# Patient Record
Sex: Female | Born: 1965 | Race: White | Hispanic: No | Marital: Single | Smoking: Current every day smoker
Health system: Southern US, Community
[De-identification: ages and names within clinical notes are randomized; demographics above are authoritative.]

## PROBLEM LIST (undated history)

## (undated) DIAGNOSIS — K9189 Other postprocedural complications and disorders of digestive system: Secondary | ICD-10-CM

## (undated) DIAGNOSIS — K567 Ileus, unspecified: Secondary | ICD-10-CM

## (undated) DIAGNOSIS — Z6831 Body mass index (BMI) 31.0-31.9, adult: Secondary | ICD-10-CM

## (undated) DIAGNOSIS — N289 Disorder of kidney and ureter, unspecified: Secondary | ICD-10-CM

## (undated) DIAGNOSIS — Z720 Tobacco use: Secondary | ICD-10-CM

## (undated) DIAGNOSIS — K5720 Diverticulitis of large intestine with perforation and abscess without bleeding: Secondary | ICD-10-CM

## (undated) HISTORY — PX: TUBAL LIGATION: SHX77

## (undated) HISTORY — DX: Disorder of kidney and ureter, unspecified: N28.9

---

## 2012-12-17 ENCOUNTER — Encounter (HOSPITAL_COMMUNITY): Payer: Self-pay | Admitting: *Deleted

## 2012-12-17 ENCOUNTER — Emergency Department (HOSPITAL_COMMUNITY): Payer: Self-pay

## 2012-12-17 ENCOUNTER — Inpatient Hospital Stay (HOSPITAL_COMMUNITY)
Admission: EM | Admit: 2012-12-17 | Discharge: 2012-12-29 | DRG: 330 | Disposition: A | Discharge status: Home / Self Care | Payer: MEDICAID | Attending: General Surgery | Admitting: General Surgery

## 2012-12-17 DIAGNOSIS — Z6831 Body mass index (BMI) 31.0-31.9, adult: Secondary | ICD-10-CM

## 2012-12-17 DIAGNOSIS — K5732 Diverticulitis of large intestine without perforation or abscess without bleeding: Principal | ICD-10-CM | POA: Diagnosis present

## 2012-12-17 DIAGNOSIS — K567 Ileus, unspecified: Secondary | ICD-10-CM | POA: Diagnosis not present

## 2012-12-17 DIAGNOSIS — Z720 Tobacco use: Secondary | ICD-10-CM | POA: Diagnosis present

## 2012-12-17 DIAGNOSIS — K630 Abscess of intestine: Secondary | ICD-10-CM | POA: Diagnosis present

## 2012-12-17 DIAGNOSIS — K5720 Diverticulitis of large intestine with perforation and abscess without bleeding: Secondary | ICD-10-CM | POA: Diagnosis present

## 2012-12-17 DIAGNOSIS — K5792 Diverticulitis of intestine, part unspecified, without perforation or abscess without bleeding: Secondary | ICD-10-CM

## 2012-12-17 DIAGNOSIS — Z23 Encounter for immunization: Secondary | ICD-10-CM

## 2012-12-17 DIAGNOSIS — Y838 Other surgical procedures as the cause of abnormal reaction of the patient, or of later complication, without mention of misadventure at the time of the procedure: Secondary | ICD-10-CM | POA: Diagnosis not present

## 2012-12-17 DIAGNOSIS — D62 Acute posthemorrhagic anemia: Secondary | ICD-10-CM | POA: Diagnosis present

## 2012-12-17 DIAGNOSIS — F17200 Nicotine dependence, unspecified, uncomplicated: Secondary | ICD-10-CM | POA: Diagnosis present

## 2012-12-17 DIAGNOSIS — K631 Perforation of intestine (nontraumatic): Secondary | ICD-10-CM

## 2012-12-17 DIAGNOSIS — K9189 Other postprocedural complications and disorders of digestive system: Secondary | ICD-10-CM | POA: Diagnosis not present

## 2012-12-17 DIAGNOSIS — K929 Disease of digestive system, unspecified: Secondary | ICD-10-CM | POA: Diagnosis not present

## 2012-12-17 DIAGNOSIS — K560 Paralytic ileus: Secondary | ICD-10-CM | POA: Diagnosis not present

## 2012-12-17 HISTORY — DX: Ileus, unspecified: K56.7

## 2012-12-17 HISTORY — DX: Body mass index (bmi) 31.0-31.9, adult: Z68.31

## 2012-12-17 HISTORY — DX: Other postprocedural complications and disorders of digestive system: K91.89

## 2012-12-17 HISTORY — DX: Diverticulitis of large intestine with perforation and abscess without bleeding: K57.20

## 2012-12-17 HISTORY — DX: Tobacco use: Z72.0

## 2012-12-17 LAB — COMPREHENSIVE METABOLIC PANEL
ALT: 7 U/L (ref 0–35)
AST: 10 U/L (ref 0–37)
Albumin: 3.3 g/dL — ABNORMAL LOW (ref 3.5–5.2)
Alkaline Phosphatase: 62 U/L (ref 39–117)
CO2: 22 mEq/L (ref 19–32)
Chloride: 102 mEq/L (ref 96–112)
GFR calc non Af Amer: >90 mL/min (ref >90)
Potassium: 3.8 mEq/L (ref 3.5–5.1)
Sodium: 138 mEq/L (ref 135–145)
Total Bilirubin: 0.2 mg/dL — ABNORMAL LOW (ref 0.3–1.2)

## 2012-12-17 LAB — WET PREP, GENITAL
Clue Cells Wet Prep HPF POC: NONE SEEN
Trich, Wet Prep: NONE SEEN

## 2012-12-17 LAB — URINALYSIS, ROUTINE W REFLEX MICROSCOPIC
Glucose, UA: NEGATIVE mg/dL
Leukocytes, UA: NEGATIVE
Nitrite: NEGATIVE
Protein, ur: NEGATIVE mg/dL
pH: 5 (ref 5.0–8.0)

## 2012-12-17 LAB — CBC WITH DIFFERENTIAL/PLATELET
Basophils Absolute: 0 10*3/uL (ref 0.0–0.1)
Basophils Relative: 0 % (ref 0–1)
HCT: 37.7 % (ref 36.0–46.0)
Lymphocytes Relative: 8 % — ABNORMAL LOW (ref 12–46)
MCHC: 32.4 g/dL (ref 30.0–36.0)
Monocytes Absolute: 0.2 10*3/uL (ref 0.1–1.0)
Neutro Abs: 5.2 10*3/uL (ref 1.7–7.7)
Neutrophils Relative %: 88 % — ABNORMAL HIGH (ref 43–77)
Platelets: 225 10*3/uL (ref 150–400)
RDW: 15.3 % (ref 11.5–15.5)
WBC: 5.9 10*3/uL (ref 4.0–10.5)

## 2012-12-17 LAB — POCT PREGNANCY, URINE: Preg Test, Ur: NEGATIVE

## 2012-12-17 MED ORDER — IOHEXOL 300 MG/ML  SOLN
50.0000 mL | Freq: Once | INTRAMUSCULAR | Status: AC | PRN
  Administered 2012-12-17: 50 mL via ORAL

## 2012-12-17 MED ORDER — IOHEXOL 300 MG/ML  SOLN
100.0000 mL | Freq: Once | INTRAMUSCULAR | Status: AC | PRN
  Administered 2012-12-17: 100 mL via INTRAVENOUS

## 2012-12-17 MED ORDER — LIDOCAINE HCL (PF) 1 % IJ SOLN
INTRAMUSCULAR | Status: AC
  Administered 2012-12-17: 5 mL
  Filled 2012-12-17: qty 5

## 2012-12-17 MED ORDER — HYDROMORPHONE HCL PF 1 MG/ML IJ SOLN
1.0000 mg | Freq: Once | INTRAMUSCULAR | Status: AC
  Administered 2012-12-17: 1 mg via INTRAVENOUS
  Filled 2012-12-17: qty 1

## 2012-12-17 MED ORDER — HYDROMORPHONE HCL PF 1 MG/ML IJ SOLN
1.0000 mg | Freq: Once | INTRAMUSCULAR | Status: AC
  Administered 2012-12-18: 1 mg via INTRAVENOUS
  Filled 2012-12-17: qty 1

## 2012-12-17 MED ORDER — CEFTRIAXONE SODIUM 250 MG IJ SOLR
250.0000 mg | Freq: Once | INTRAMUSCULAR | Status: AC
  Administered 2012-12-17: 250 mg via INTRAMUSCULAR
  Filled 2012-12-17: qty 250

## 2012-12-17 MED ORDER — CIPROFLOXACIN IN D5W 400 MG/200ML IV SOLN
400.0000 mg | Freq: Once | INTRAVENOUS | Status: AC
  Administered 2012-12-17: 400 mg via INTRAVENOUS
  Filled 2012-12-17: qty 200

## 2012-12-17 MED ORDER — ACETAMINOPHEN 325 MG PO TABS
975.0000 mg | ORAL_TABLET | Freq: Once | ORAL | Status: AC
  Administered 2012-12-17: 975 mg via ORAL
  Filled 2012-12-17: qty 3

## 2012-12-17 MED ORDER — SODIUM CHLORIDE 0.9 % IV BOLUS (SEPSIS)
1000.0000 mL | Freq: Once | INTRAVENOUS | Status: AC
  Administered 2012-12-17: 1000 mL via INTRAVENOUS

## 2012-12-17 MED ORDER — METRONIDAZOLE IN NACL 5-0.79 MG/ML-% IV SOLN
500.0000 mg | Freq: Once | INTRAVENOUS | Status: AC
  Administered 2012-12-17: 500 mg via INTRAVENOUS
  Filled 2012-12-17: qty 100

## 2012-12-17 MED ORDER — AZITHROMYCIN 250 MG PO TABS
1000.0000 mg | ORAL_TABLET | Freq: Once | ORAL | Status: AC
  Administered 2012-12-17: 1000 mg via ORAL
  Filled 2012-12-17: qty 4

## 2012-12-17 NOTE — ED Provider Notes (Signed)
History     CSN: [id number]  Arrival date & time 12/17/12  1622   First MD Initiated Contact with Patient 12/17/12 1659      Chief Complaint  Patient presents with  . Abdominal Pain  . Vaginal Bleeding    (Consider location/radiation/quality/duration/timing/severity/associated sxs/prior treatment) The history is provided by the patient and medical records.    Joanne Mora is a 47 y.o. female  with a hx of tubal ligation presents to the Emergency Department complaining of gradual, persistent, progressively worsening pelvic and abdominal pain onset this AM.  Pt noted spotty vaginal bleeding and discharge 2 days ago and pelvic pressure which have resolved  Pt thought it was a UTI.  LMP was 2 weeks ago.   Then this AM pt awoke with RQU pain that radiates to the lower abd.  Pt is sexually active with the same partner for the last 2 years and does not use protection.  Pt describes that pain as sharp, stabbing that radiates throughout the R side of the and, rated at a 10/10.  Associated symptoms include nausea.  Nothing makes it better and nothing makes it worse.  Pt denies fever, chills, headache, chest pain, shortness of breath, nausea, vomiting, diarrhea, weakness, dizziness, syncope.    Pt states weight gain in the last few months along with swelling of her hands and feet.    History reviewed. No pertinent past medical history.  History reviewed. No pertinent past surgical history.  No family history on file.  History  Substance Use Topics  . Smoking status: Current Every Day Smoker -- 1.0 packs/day    Types: Cigarettes  . Smokeless tobacco: Not on file  . Alcohol Use: Yes     Comment: socially    OB History    Grav Para Term Preterm Abortions TAB SAB Ect Mult Living                  Review of Systems  Constitutional: Negative for fever, diaphoresis, appetite change, fatigue and unexpected weight change.  HENT: Negative for mouth sores, trouble swallowing, neck pain and  neck stiffness.   Respiratory: Negative for cough, chest tightness, shortness of breath, wheezing and stridor.   Cardiovascular: Negative for chest pain and palpitations.  Gastrointestinal: Positive for abdominal pain. Negative for nausea, vomiting, diarrhea, constipation, blood in stool, abdominal distention and rectal pain.  Genitourinary: Positive for vaginal bleeding, vaginal discharge and pelvic pain. Negative for dysuria, urgency, frequency, hematuria, flank pain and difficulty urinating.  Musculoskeletal: Negative for back pain.  Skin: Negative for rash.  Neurological: Negative for weakness.  Hematological: Negative for adenopathy.  Psychiatric/Behavioral: Negative for confusion.  All other systems reviewed and are negative.    Allergies  Review of patient's allergies indicates no known allergies.  Home Medications   Current Outpatient Rx  Name  Route  Sig  Dispense  Refill  . BC HEADACHE PO   Oral   Take 1 packet by mouth daily as needed. For headache pain           BP 109/43  Pulse 90  Temp 100.4 F (38 C) (Oral)  Resp 18  SpO2 98%  LMP 12/03/2012  Physical Exam  Nursing note and vitals reviewed. Constitutional: She is oriented to person, place, and time. She appears well-developed and well-nourished.  HENT:  Head: Normocephalic and atraumatic.  Mouth/Throat: Oropharynx is clear and moist.  Eyes: Conjunctivae normal are normal. Pupils are equal, round, and reactive to light. No scleral icterus.  Neck: Normal range of motion.  Cardiovascular: Normal rate, regular rhythm, normal heart sounds and intact distal pulses.  Exam reveals no gallop and no friction rub.   No murmur heard. Pulmonary/Chest: Effort normal and breath sounds normal. No respiratory distress. She has no wheezes. She has no rales. She exhibits no tenderness.  Abdominal: Soft. Normal appearance and bowel sounds are normal. She exhibits distension. She exhibits no mass. There is tenderness in the  right upper quadrant and right lower quadrant. There is rigidity, rebound and guarding. There is no CVA tenderness and negative Murphy's sign. Hernia confirmed negative in the right inguinal area and confirmed negative in the left inguinal area.         Patient with peritoneal signs along the right side of her abdomen  Genitourinary: Pelvic exam was performed with patient supine. No labial fusion. There is no rash, tenderness, lesion or injury on the right labia. There is no rash, tenderness, lesion or injury on the left labia. Uterus is not deviated, not enlarged, not fixed and not tender. Cervix exhibits discharge. Cervix exhibits no motion tenderness and no friability. Right adnexum displays no mass, no tenderness and no fullness. Left adnexum displays no mass, no tenderness and no fullness. There is bleeding around the vagina. No erythema or tenderness around the vagina. No foreign body around the vagina. No signs of injury around the vagina. Vaginal discharge found.  Musculoskeletal: She exhibits no edema and no tenderness.  Lymphadenopathy:    She has no cervical adenopathy.       Right: No inguinal adenopathy present.       Left: No inguinal adenopathy present.  Neurological: She is alert and oriented to person, place, and time. She exhibits normal muscle tone. Coordination normal.  Skin: Skin is warm and dry. No rash noted. No erythema.  Psychiatric: She has a normal mood and affect.    ED Course  Procedures (including critical care time)  Labs Reviewed  URINALYSIS, ROUTINE W REFLEX MICROSCOPIC - Abnormal; Notable for the following:    APPearance HAZY (*)     All other components within normal limits  CBC WITH DIFFERENTIAL - Abnormal; Notable for the following:    Neutrophils Relative 88 (*)     Lymphocytes Relative 8 (*)     Lymphs Abs 0.5 (*)     All other components within normal limits  COMPREHENSIVE METABOLIC PANEL - Abnormal; Notable for the following:    Glucose, Bld 132  (*)     Albumin 3.3 (*)     Total Bilirubin 0.2 (*)     All other components within normal limits  WET PREP, GENITAL - Abnormal; Notable for the following:    WBC, Wet Prep HPF POC TOO NUMEROUS TO COUNT (*)     All other components within normal limits  LIPASE, BLOOD  HCG, QUANTITATIVE, PREGNANCY  POCT PREGNANCY, URINE  GC/CHLAMYDIA PROBE AMP   Ct Abdomen Pelvis W Contrast  12/17/2012  *RADIOLOGY REPORT*  Clinical Data: Abdominal pain and fever.  CT ABDOMEN AND PELVIS WITH CONTRAST  Technique:  Multidetector CT imaging of the abdomen and pelvis was performed following the standard protocol during bolus administration of intravenous contrast.  Contrast: OMNIPAQUE IOHEXOL 300 MG/ML  SOLN  Comparison: None.  Findings: There is bibasilar atelectasis.  Study is positive for free intraperitoneal air.  Small locules of gas throughout the anterior abdomen.  There is a focus of inflammation around the sigmoid colon and a diverticulum.  There is fluid  in the small bowel mesentery, right lower quadrant and pelvis.  Fluid is centered around loops of bowel on image 65.  There is mild small bowel dilatation.  There is contrast within the right colon and no evidence for a high grade obstruction.  Small amount of fluid around the liver.  There is no focal abnormality within the liver and normal appearance of the portal venous system and gallbladder.  There is fullness of the pancreatic head but there is no discrete lesion and no significant biliary or pancreatic duct dilatation.  Normal appearance of the spleen, adrenal glands and both kidneys.  No gross abnormality to the uterus or adnexal structures. The appendix is near the right adnexa and difficult to evaluate the appendix.  Urinary bladder is mildly distended and there is a small focus of gas within the bladder which is likely iatrogenic.  No acute bony abnormality.  IMPRESSION: Diffuse inflammatory process in the abdomen with free intraperitoneal air.  The  focus of inflammation is centered around a diverticulum in the sigmoid colon.  Findings suggest acute diverticulitis.  There is not a discrete abscess collection but there is fluid and gas locules above the inflamed colon and near small bowel loops.  Mild small bowel dilatation without high grade obstruction.  Critical Value/emergent results were called by telephone at the time of interpretation on 12/18/2012 at 10:02 p.m. to Dr. Silverio Lay, who verbally acknowledged these results.   Original Report Authenticated By: Richarda Overlie, M.D.      1. Diverticulitis   2. Perforated sigmoid colon       MDM  Merlyn Lot presents with abdominal pain concern for serious intra-abdominal etiology including appendicitis, cholecystitis, bowel obstruction or perforated colon.  Also considering pelvic etiologies and unlikely pregnancy.   2 testing Quant negative, wet prep with white blood cells too numerous to count and significant malodorous and bloody discharge on exam. We will treat prophylactically for GC and Chlamydia. CBC unremarkable, lipase unremarkable, CBC unremarkable and without leukocytosis.  Patient now with fever; treat here in the department. Will obtain CT scan to rule out other serious concerns.  CT scan with Diffuse inflammatory process in the abdomen with free intraperitoneal air. The focus of inflammation is centered around a diverticulum in the sigmoid colon. Findings suggest acute diverticulitis.  We'll consult general surgery for admission. Patient last oral intake this morning.   Dr Janee Morn from [facility] will evaluate.           Dahlia Client Kareli Hossain, PA-C 12/17/12 2315

## 2012-12-17 NOTE — ED Notes (Signed)
[id number] Romeo Apple [phone] pts brothers

## 2012-12-17 NOTE — ED Provider Notes (Signed)
Medical screening examination/treatment/procedure(s) were performed by non-physician practitioner and as supervising physician I was immediately available for consultation/collaboration.   Richardean Canal, MD 12/17/12 [phone]

## 2012-12-17 NOTE — H&P (Signed)
Joanne Mora is an 47 y.o. female.   Chief Complaint: Abdominal pain HPI: [name] She has been having regular bowel movements. She denies blood in her stool. The pain moved up from her pelvis more centrally in her abdomen today and she came to the emergency department for evaluation. Laboratory studies are normal but CT scan of the abdomen and pelvis demonstrates a significant sigmoid diverticulitis with small perforation and small dots of free air. Her pain is improved somewhat after receiving pain medication.  History reviewed. No pertinent past medical history.  Past surgical history: Laparoscopic tubal ligation  No family history on file. Social History:  reports that she has been smoking Cigarettes.  She has been smoking about 1 pack per day. She does not have any smokeless tobacco history on file. She reports that she drinks alcohol. She reports that she does not use illicit drugs. 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  Allergies: No Known Allergies   (Not in a hospital admission)  Results for orders placed during the hospital encounter of 12/17/12 (from the past 48 hour(s))  CBC WITH DIFFERENTIAL     Status: Abnormal   Collection Time   12/17/12  4:51 PM      Component Value Range Comment   WBC 5.9  4.0 - 10.5 K/uL    RBC 4.21  3.87 - 5.11 MIL/uL    Hemoglobin 12.2  12.0 - 15.0 g/dL    HCT 91.4  78.2 - 95.6 %    MCV 89.5  78.0 - 100.0 fL    MCH 29.0  26.0 - 34.0 pg    MCHC 32.4  30.0 - 36.0 g/dL    RDW 21.3  08.6 - 57.8 %    Platelets 225  150 - 400 K/uL    Neutrophils Relative 88 (*) 43 - 77 %    Neutro Abs 5.2  1.7 - 7.7 K/uL    Lymphocytes Relative 8 (*) 12 - 46 %    Lymphs Abs 0.5 (*) 0.7 - 4.0 K/uL    Monocytes Relative 4  3 - 12 %    Monocytes Absolute 0.2  0.1 - 1.0 K/uL    Eosinophils Relative 0  0 - 5 %    Eosinophils Absolute 0.0  0.0 - 0.7 K/uL    Basophils Relative 0  0 - 1 %    Basophils  Absolute 0.0  0.0 - 0.1 K/uL   COMPREHENSIVE METABOLIC PANEL     Status: Abnormal   Collection Time   12/17/12  4:51 PM      Component Value Range Comment   Sodium 138  135 - 145 mEq/L    Potassium 3.8  3.5 - 5.1 mEq/L    Chloride 102  96 - 112 mEq/L    CO2 22  19 - 32 mEq/L    Glucose, Bld 132 (*) 70 - 99 mg/dL    BUN 10  6 - 23 mg/dL    Creatinine, Ser 4.69  0.50 - 1.10 mg/dL    Calcium 8.8  8.4 - 62.9 mg/dL    Total Protein 7.8  6.0 - 8.3 g/dL    Albumin 3.3 (*) 3.5 - 5.2 g/dL    AST 10  0 - 37 U/L    ALT 7  0 - 35 U/L    Alkaline Phosphatase 62  39 - 117 U/L    Total Bilirubin 0.2 (*) 0.3 -  1.2 mg/dL    GFR calc non Af Amer >90  >90 mL/min    GFR calc Af Amer >90  >90 mL/min   LIPASE, BLOOD     Status: Normal   Collection Time   12/17/12  4:51 PM      Component Value Range Comment   Lipase 12  11 - 59 U/L   HCG, QUANTITATIVE, PREGNANCY     Status: Normal   Collection Time   12/17/12  5:37 PM      Component Value Range Comment   hCG, Beta Chain, Quant, S <1  <5 mIU/mL   URINALYSIS, ROUTINE W REFLEX MICROSCOPIC     Status: Abnormal   Collection Time   12/17/12  6:18 PM      Component Value Range Comment   Color, Urine YELLOW  YELLOW    APPearance HAZY (*) CLEAR    Specific Gravity, Urine 1.026  1.005 - 1.030    pH 5.0  5.0 - 8.0    Glucose, UA NEGATIVE  NEGATIVE mg/dL    Hgb urine dipstick NEGATIVE  NEGATIVE    Bilirubin Urine NEGATIVE  NEGATIVE    Ketones, ur NEGATIVE  NEGATIVE mg/dL    Protein, ur NEGATIVE  NEGATIVE mg/dL    Urobilinogen, UA 0.2  0.0 - 1.0 mg/dL    Nitrite NEGATIVE  NEGATIVE    Leukocytes, UA NEGATIVE  NEGATIVE MICROSCOPIC NOT DONE ON URINES WITH NEGATIVE PROTEIN, BLOOD, LEUKOCYTES, NITRITE, OR GLUCOSE <1000 mg/dL.  POCT PREGNANCY, URINE     Status: Normal   Collection Time   12/17/12  6:23 PM      Component Value Range Comment   Preg Test, Ur NEGATIVE  NEGATIVE   WET PREP, GENITAL     Status: Abnormal   Collection Time   12/17/12  6:44 PM       Component Value Range Comment   Yeast Wet Prep HPF POC NONE SEEN  NONE SEEN SPECIMEN OVER DILUTED PRIOR TO ARRIVAL IN LABORATORY   Trich, Wet Prep NONE SEEN  NONE SEEN    Clue Cells Wet Prep HPF POC NONE SEEN  NONE SEEN    WBC, Wet Prep HPF POC TOO NUMEROUS TO COUNT (*) NONE SEEN    Ct Abdomen Pelvis W Contrast  12/17/2012  *RADIOLOGY REPORT*  Clinical Data: Abdominal pain and fever.  CT ABDOMEN AND PELVIS WITH CONTRAST  Technique:  Multidetector CT imaging of the abdomen and pelvis was performed following the standard protocol during bolus administration of intravenous contrast.  Contrast: OMNIPAQUE IOHEXOL 300 MG/ML  SOLN  Comparison: None.  Findings: There is bibasilar atelectasis.  Study is positive for free intraperitoneal air.  Small locules of gas throughout the anterior abdomen.  There is a focus of inflammation around the sigmoid colon and a diverticulum.  There is fluid in the small bowel mesentery, right lower quadrant and pelvis.  Fluid is centered around loops of bowel on image 65.  There is mild small bowel dilatation.  There is contrast within the right colon and no evidence for a high grade obstruction.  Small amount of fluid around the liver.  There is no focal abnormality within the liver and normal appearance of the portal venous system and gallbladder.  There is fullness of the pancreatic head but there is no discrete lesion and no significant biliary or pancreatic duct dilatation.  Normal appearance of the spleen, adrenal glands and both kidneys.  No gross abnormality to the uterus or adnexal structures. The  appendix is near the right adnexa and difficult to evaluate the appendix.  Urinary bladder is mildly distended and there is a small focus of gas within the bladder which is likely iatrogenic.  No acute bony abnormality.  IMPRESSION: Diffuse inflammatory process in the abdomen with free intraperitoneal air.  The focus of inflammation is centered around a diverticulum in the  sigmoid colon.  Findings suggest acute diverticulitis.  There is not a discrete abscess collection but there is fluid and gas locules above the inflamed colon and near small bowel loops.  Mild small bowel dilatation without high grade obstruction.  Critical Value/emergent results were called by telephone at the time of interpretation on 12/18/2012 at 10:02 p.m. to Dr. Silverio Lay, who verbally acknowledged these results.   Original Report Authenticated By: Richarda Overlie, M.D.     Review of Systems  Constitutional: Positive for fever.  HENT: Negative.   Eyes: Negative.   Respiratory: Negative.   Cardiovascular: Negative.   Gastrointestinal: Positive for abdominal pain. Negative for nausea, diarrhea, constipation and blood in stool.  Genitourinary: Negative.   Musculoskeletal: Negative.   Skin: Negative.   Neurological: Negative.   Endo/Heme/Allergies: Negative.     Blood pressure 109/43, pulse 90, temperature 100.4 F (38 C), temperature source Oral, resp. rate 18, last menstrual period 12/03/2012, SpO2 98.00%. Physical Exam  Constitutional: She is oriented to person, place, and time. She appears well-developed and well-nourished.  HENT:  Head: Normocephalic and atraumatic.  Mouth/Throat: Oropharynx is clear and moist. No oropharyngeal exudate.  Eyes: EOM are normal. Pupils are equal, round, and reactive to light. No scleral icterus.  Neck: Normal range of motion. Neck supple. No tracheal deviation present.  Cardiovascular: Normal rate and regular rhythm.   No murmur heard. Respiratory: Effort normal and breath sounds normal. No stridor. No respiratory distress. She has no wheezes. She has no rales.  GI: Soft. She exhibits no distension and no mass. There is tenderness. There is no rebound and no guarding.       Tenderness mostly located  in the mid abdomen and left lower quadrant, tenderness is significant but no peritoneal signs, bowel sounds are hypoactive  Musculoskeletal: Normal range of  motion.  Neurological: She is alert and oriented to person, place, and time.  Skin: Skin is warm.     Assessment/Plan Significant sigmoid diverticulitis with small perforation and some scattered dots of free intraperitoneal air. CT was reviewed with the radiologist. It appears an abscess may form over the next few days.  We'll plan admission, IV antibiotics, bowel rest, and will place Foley catheter. I had a  long discussion with the patient. If she does not improve in a short amount of time she will need colectomy with colostomy. She is very  reluctant to undergo this surgery and wants to see how things go with medical management.  She is aware that emergent surgery may be necessary.  Dalasia Predmore E 12/17/2012, 11:51 PM

## 2012-12-17 NOTE — ED Notes (Signed)
Pt with vaginal bleeding and pelvic pain 2 days ago.  Today she exp acute onset L upper and lower quadrant pain as well as the pelvic pain.  Normal bm this am.  Denies nvd or urinary s/s.  Afebrile.  Hx of tubal ligation 5 years prior.

## 2012-12-17 NOTE — ED Notes (Signed)
CT called; pt completed CT contrast 

## 2012-12-17 NOTE — ED Notes (Signed)
Joanne Mora, NA reported no urine return upon urinary catheterization; will report to [place], [place]; pt has 1000 ml bolus of sodium chloride 0.9% infusing at this time

## 2012-12-18 DIAGNOSIS — K5732 Diverticulitis of large intestine without perforation or abscess without bleeding: Secondary | ICD-10-CM

## 2012-12-18 LAB — CBC
HCT: 33 % — ABNORMAL LOW (ref 36.0–46.0)
Hemoglobin: 10.5 g/dL — ABNORMAL LOW (ref 12.0–15.0)
MCH: 28.3 pg (ref 26.0–34.0)
MCHC: 31.8 g/dL (ref 30.0–36.0)
MCV: 88.9 fL (ref 78.0–100.0)
RDW: 15.4 % (ref 11.5–15.5)

## 2012-12-18 LAB — BASIC METABOLIC PANEL
BUN: 8 mg/dL (ref 6–23)
CO2: 22 mEq/L (ref 19–32)
Chloride: 101 mEq/L (ref 96–112)
Creatinine, Ser: 0.57 mg/dL (ref 0.50–1.10)
Glucose, Bld: 119 mg/dL — ABNORMAL HIGH (ref 70–99)

## 2012-12-18 LAB — GC/CHLAMYDIA PROBE AMP: CT Probe RNA: NEGATIVE

## 2012-12-18 MED ORDER — HEPARIN SODIUM (PORCINE) 5000 UNIT/ML IJ SOLN
5000.0000 [IU] | Freq: Three times a day (TID) | INTRAMUSCULAR | Status: DC
  Administered 2012-12-18 – 2012-12-29 (×33): 5000 [IU] via SUBCUTANEOUS
  Filled 2012-12-18 (×37): qty 1

## 2012-12-18 MED ORDER — PANTOPRAZOLE SODIUM 40 MG IV SOLR
40.0000 mg | Freq: Every day | INTRAVENOUS | Status: DC
  Administered 2012-12-18 – 2012-12-26 (×10): 40 mg via INTRAVENOUS
  Filled 2012-12-18 (×11): qty 40

## 2012-12-18 MED ORDER — DIPHENHYDRAMINE HCL 50 MG/ML IJ SOLN: 12.5000 mg | Freq: Four times a day (QID) | INTRAMUSCULAR | Status: DC | PRN

## 2012-12-18 MED ORDER — KCL IN DEXTROSE-NACL 20-5-0.45 MEQ/L-%-% IV SOLN
INTRAVENOUS | Status: DC
  Administered 2012-12-18: 21:00:00 via INTRAVENOUS
  Administered 2012-12-18: 150 mL via INTRAVENOUS
  Administered 2012-12-18 – 2012-12-22 (×10): via INTRAVENOUS
  Administered 2012-12-22: 150 mL/h via INTRAVENOUS
  Administered 2012-12-22 – 2012-12-24 (×2): via INTRAVENOUS
  Administered 2012-12-24: 1000 mL via INTRAVENOUS
  Administered 2012-12-25 – 2012-12-29 (×6): via INTRAVENOUS
  Filled 2012-12-18 (×35): qty 1000

## 2012-12-18 MED ORDER — PNEUMOCOCCAL VAC POLYVALENT 25 MCG/0.5ML IJ INJ
0.5000 mL | INJECTION | INTRAMUSCULAR | Status: DC
  Filled 2012-12-18: qty 0.5

## 2012-12-18 MED ORDER — ACETAMINOPHEN 325 MG PO TABS
650.0000 mg | ORAL_TABLET | Freq: Four times a day (QID) | ORAL | Status: DC | PRN
  Administered 2012-12-18: 650 mg via ORAL
  Filled 2012-12-18: qty 2

## 2012-12-18 MED ORDER — SODIUM CHLORIDE 0.9 % IV SOLN
1.0000 g | INTRAVENOUS | Status: DC
  Administered 2012-12-18 – 2012-12-28 (×11): 1 g via INTRAVENOUS
  Filled 2012-12-18 (×12): qty 1

## 2012-12-18 MED ORDER — ACETAMINOPHEN 10 MG/ML IV SOLN
1000.0000 mg | Freq: Four times a day (QID) | INTRAVENOUS | Status: AC
  Administered 2012-12-18 – 2012-12-19 (×4): 1000 mg via INTRAVENOUS
  Filled 2012-12-18 (×4): qty 100

## 2012-12-18 MED ORDER — NICOTINE 21 MG/24HR TD PT24
21.0000 mg | MEDICATED_PATCH | Freq: Every day | TRANSDERMAL | Status: DC
  Administered 2012-12-18 – 2012-12-28 (×11): 21 mg via TRANSDERMAL
  Filled 2012-12-18 (×12): qty 1

## 2012-12-18 MED ORDER — ONDANSETRON HCL 4 MG/2ML IJ SOLN: 4.0000 mg | Freq: Four times a day (QID) | INTRAMUSCULAR | Status: DC | PRN

## 2012-12-18 MED ORDER — HYDROMORPHONE HCL PF 1 MG/ML IJ SOLN
1.0000 mg | INTRAMUSCULAR | Status: DC | PRN
  Administered 2012-12-18 – 2012-12-19 (×9): 2 mg via INTRAVENOUS
  Administered 2012-12-19: 1 mg via INTRAVENOUS
  Administered 2012-12-19 – 2012-12-20 (×6): 2 mg via INTRAVENOUS
  Administered 2012-12-20: 1 mg via INTRAVENOUS
  Administered 2012-12-20: 2 mg via INTRAVENOUS
  Administered 2012-12-20 (×2): 1 mg via INTRAVENOUS
  Filled 2012-12-18: qty 3
  Filled 2012-12-18 (×2): qty 2
  Filled 2012-12-18: qty 1
  Filled 2012-12-18: qty 2
  Filled 2012-12-18: qty 1
  Filled 2012-12-18 (×5): qty 2
  Filled 2012-12-18: qty 1
  Filled 2012-12-18 (×4): qty 2
  Filled 2012-12-18: qty 1
  Filled 2012-12-18 (×3): qty 2

## 2012-12-18 MED ORDER — DIPHENHYDRAMINE HCL 12.5 MG/5ML PO ELIX: 12.5000 mg | ORAL_SOLUTION | Freq: Four times a day (QID) | ORAL | Status: DC | PRN

## 2012-12-18 NOTE — Progress Notes (Signed)
Patient arrived to room 6N09  from emergency room .Assisted to bed by staff.No acute distress noted at present time.

## 2012-12-18 NOTE — Progress Notes (Signed)
UR completed 

## 2012-12-18 NOTE — Progress Notes (Signed)
Patient temperature elevated 101.7.Call placed to Dr.Thompson.New orders received and carried out.

## 2012-12-18 NOTE — Progress Notes (Signed)
Subjective: Pt complaining of diffuse pain/tenderness in RLQ and periumbilical.  Pt denies N/V.  Not able to ambulate due to pain.  Catheter in place.    Objective: Vital signs in last 24 hours: Temp:  [98.2 F (36.8 C)-102 F (38.9 C)] 100 F (37.8 C) (01/24 1014) Pulse Rate:  [88-100] 100  (01/24 1014) Resp:  [16-20] 20  (01/24 1014) BP: (100-120)/(39-64) 100/64 mmHg (01/24 1014) SpO2:  [95 %-100 %] 95 % (01/24 1014) Weight:  [221 lb (100.245 kg)] 221 lb (100.245 kg) (01/24 0115) Last BM Date: 12/17/12  Intake/Output from previous day: 01/23 0701 - 01/24 0700 In: 50 [IV Piggyback:50] Out: -  Intake/Output this shift: Total I/O In: 0  Out: 900 [Urine:900]  PE: Gen:  Alert, NAD, pleasant Card:  RRR, no M/G/R heard Pulm:  CTA, no W/R/R Abd: Soft, mild distension, diffusely tender in RLQ and periumbically, +BS, no HSM   Lab Results:   [facility] 12/18/12 0718 12/17/12 1651  WBC 8.4 5.9  HGB 10.5* 12.2  HCT 33.0* 37.7  PLT 211 225   BMET  Basename 12/18/12 0718 12/17/12 1651  NA 136 138  K 3.4* 3.8  CL 101 102  CO2 22 22  GLUCOSE 119* 132*  BUN 8 10  CREATININE 0.57 0.60  CALCIUM 8.2* 8.8   PT/INR No results found for this basename: LABPROT:2,INR:2 in the last 72 hours CMP     Component Value Date/Time   NA 136 12/18/2012 0718   K 3.4* 12/18/2012 0718   CL 101 12/18/2012 0718   CO2 22 12/18/2012 0718   GLUCOSE 119* 12/18/2012 0718   BUN 8 12/18/2012 0718   CREATININE 0.57 12/18/2012 0718   CALCIUM 8.2* 12/18/2012 0718   PROT 7.8 12/17/2012 1651   ALBUMIN 3.3* 12/17/2012 1651   AST 10 12/17/2012 1651   ALT 7 12/17/2012 1651   ALKPHOS 62 12/17/2012 1651   BILITOT 0.2* 12/17/2012 1651   GFRNONAA >90 12/18/2012 0718   GFRAA >90 12/18/2012 0718   Lipase     Component Value Date/Time   LIPASE 12 12/17/2012 1651       Studies/Results: Ct Abdomen Pelvis W Contrast  12/17/2012  *RADIOLOGY REPORT*  Clinical Data: Abdominal pain and fever.  CT ABDOMEN AND  PELVIS WITH CONTRAST  Technique:  Multidetector CT imaging of the abdomen and pelvis was performed following the standard protocol during bolus administration of intravenous contrast.  Contrast: OMNIPAQUE IOHEXOL 300 MG/ML  SOLN  Comparison: None.  Findings: There is bibasilar atelectasis.  Study is positive for free intraperitoneal air.  Small locules of gas throughout the anterior abdomen.  There is a focus of inflammation around the sigmoid colon and a diverticulum.  There is fluid in the small bowel mesentery, right lower quadrant and pelvis.  Fluid is centered around loops of bowel on image 65.  There is mild small bowel dilatation.  There is contrast within the right colon and no evidence for a high grade obstruction.  Small amount of fluid around the liver.  There is no focal abnormality within the liver and normal appearance of the portal venous system and gallbladder.  There is fullness of the pancreatic head but there is no discrete lesion and no significant biliary or pancreatic duct dilatation.  Normal appearance of the spleen, adrenal glands and both kidneys.  No gross abnormality to the uterus or adnexal structures. The appendix is near the right adnexa and difficult to evaluate the appendix.  Urinary bladder is mildly distended  and there is a small focus of gas within the bladder which is likely iatrogenic.  No acute bony abnormality.  IMPRESSION: Diffuse inflammatory process in the abdomen with free intraperitoneal air.  The focus of inflammation is centered around a diverticulum in the sigmoid colon.  Findings suggest acute diverticulitis.  There is not a discrete abscess collection but there is fluid and gas locules above the inflamed colon and near small bowel loops.  Mild small bowel dilatation without high grade obstruction.  Critical Value/emergent results were called by telephone at the time of interpretation on 12/18/2012 at 10:02 p.m. to Dr. Silverio Lay, who verbally acknowledged these  results.   Original Report Authenticated By: Richarda Overlie, M.D.     Anti-infectives: Anti-infectives     Start     Dose/Rate Route Frequency Ordered Stop   12/18/12 0200   ertapenem (INVANZ) 1 g in sodium chloride 0.9 % 50 mL IVPB        1 g 100 mL/hr over 30 Minutes Intravenous Every 24 hours 12/18/12 0118     12/17/12 2215   ciprofloxacin (CIPRO) IVPB 400 mg        400 mg 200 mL/hr over 60 Minutes Intravenous  Once 12/17/12 2204 12/18/12 0018   12/17/12 2215   metroNIDAZOLE (FLAGYL) IVPB 500 mg        500 mg 100 mL/hr over 60 Minutes Intravenous  Once 12/17/12 2204 12/17/12 2309   12/17/12 1930   cefTRIAXone (ROCEPHIN) injection 250 mg        250 mg Intramuscular  Once 12/17/12 1924 12/17/12 2011   12/17/12 1930   azithromycin (ZITHROMAX) tablet 1,000 mg        1,000 mg Oral  Once 12/17/12 1924 12/17/12 2012           Assessment/Plan Sigmoid diverticulitis with small perforation and scattered free intraperitoneal air 1.  Continue conservative treatment for now - IV abx, bowel rest/NPO, pain control 2.  Continue foley to monitor strict I/O 3.  Fever is down, WBC normal 4.  May require emergent surgery with colectomy and/or colostomy if worsens, however pt is very hesitant towards surgery at this time   LOS: 1 day    DORT, [facility] 12/18/2012, 10:23 AM Pager: [phone]

## 2012-12-18 NOTE — Progress Notes (Signed)
Patient seen and examined.  Agree with PA's note. She strongly wants to avoid a colostomy although she is aware that if she gets worse it may come to that.

## 2012-12-19 MED ORDER — POTASSIUM CHLORIDE 10 MEQ/100ML IV SOLN
10.0000 meq | INTRAVENOUS | Status: AC
  Administered 2012-12-19 (×4): 10 meq via INTRAVENOUS
  Filled 2012-12-19 (×2): qty 200

## 2012-12-19 MED ORDER — PNEUMOCOCCAL VAC POLYVALENT 25 MCG/0.5ML IJ INJ
0.5000 mL | INJECTION | INTRAMUSCULAR | Status: AC
  Administered 2012-12-21: 0.5 mL via INTRAMUSCULAR
  Filled 2012-12-19: qty 0.5

## 2012-12-19 NOTE — Progress Notes (Signed)
Subjective: Feels about the same, no flatus, no n/v, rlq and periumbilical pain  Objective: Vital signs in last 24 hours: Temp:  [98.4 F (36.9 C)-102.6 F (39.2 C)] 99.3 F (37.4 C) (01/25 0558) Pulse Rate:  [90-108] 105  (01/25 0558) Resp:  [16-20] 16  (01/25 0558) BP: (97-116)/(48-64) 112/48 mmHg (01/25 0558) SpO2:  [93 %-97 %] 93 % (01/25 0558) Last BM Date: 12/17/12  Intake/Output from previous day: 01/24 0701 - 01/25 0700 In: 4740 [I.V.:4390; IV Piggyback:350] Out: 3925 [Urine:3925] Intake/Output this shift:    General appearance: no distress Resp: clear to auscultation bilaterally Cardio: regular rate and rhythm GI: tender llq>rlq, mildly tender diffusely, some bs  Lab Results:   [facility] 12/18/12 0718 12/17/12 1651  WBC 8.4 5.9  HGB 10.5* 12.2  HCT 33.0* 37.7  PLT 211 225   BMET  Basename 12/18/12 0718 12/17/12 1651  NA 136 138  K 3.4* 3.8  CL 101 102  CO2 22 22  GLUCOSE 119* 132*  BUN 8 10  CREATININE 0.57 0.60  CALCIUM 8.2* 8.8   PT/INR No results found for this basename: LABPROT:2,INR:2 in the last 72 hours ABG No results found for this basename: PHART:2,PCO2:2,PO2:2,HCO3:2 in the last 72 hours  Studies/Results: Ct Abdomen Pelvis W Contrast  12/17/2012  *RADIOLOGY REPORT*  Clinical Data: Abdominal pain and fever.  CT ABDOMEN AND PELVIS WITH CONTRAST  Technique:  Multidetector CT imaging of the abdomen and pelvis was performed following the standard protocol during bolus administration of intravenous contrast.  Contrast: OMNIPAQUE IOHEXOL 300 MG/ML  SOLN  Comparison: None.  Findings: There is bibasilar atelectasis.  Study is positive for free intraperitoneal air.  Small locules of gas throughout the anterior abdomen.  There is a focus of inflammation around the sigmoid colon and a diverticulum.  There is fluid in the small bowel mesentery, right lower quadrant and pelvis.  Fluid is centered around loops of bowel on image 65.  There is mild  small bowel dilatation.  There is contrast within the right colon and no evidence for a high grade obstruction.  Small amount of fluid around the liver.  There is no focal abnormality within the liver and normal appearance of the portal venous system and gallbladder.  There is fullness of the pancreatic head but there is no discrete lesion and no significant biliary or pancreatic duct dilatation.  Normal appearance of the spleen, adrenal glands and both kidneys.  No gross abnormality to the uterus or adnexal structures. The appendix is near the right adnexa and difficult to evaluate the appendix.  Urinary bladder is mildly distended and there is a small focus of gas within the bladder which is likely iatrogenic.  No acute bony abnormality.  IMPRESSION: Diffuse inflammatory process in the abdomen with free intraperitoneal air.  The focus of inflammation is centered around a diverticulum in the sigmoid colon.  Findings suggest acute diverticulitis.  There is not a discrete abscess collection but there is fluid and gas locules above the inflamed colon and near small bowel loops.  Mild small bowel dilatation without high grade obstruction.  Critical Value/emergent results were called by telephone at the time of interpretation on 12/18/2012 at 10:02 p.m. to Dr. Silverio Lay, who verbally acknowledged these results.   Original Report Authenticated By: Richarda Overlie, M.D.     Anti-infectives: Anti-infectives     Start     Dose/Rate Route Frequency Ordered Stop   12/18/12 0200   ertapenem (INVANZ) 1 g in sodium chloride 0.9 %  50 mL IVPB        1 g 100 mL/hr over 30 Minutes Intravenous Every 24 hours 12/18/12 0118     12/17/12 2215   ciprofloxacin (CIPRO) IVPB 400 mg        400 mg 200 mL/hr over 60 Minutes Intravenous  Once 12/17/12 2204 12/18/12 0018   12/17/12 2215   metroNIDAZOLE (FLAGYL) IVPB 500 mg        500 mg 100 mL/hr over 60 Minutes Intravenous  Once 12/17/12 2204 12/17/12 2309   12/17/12 1930   cefTRIAXone  (ROCEPHIN) injection 250 mg        250 mg Intramuscular  Once 12/17/12 1924 12/17/12 2011   12/17/12 1930   azithromycin (ZITHROMAX) tablet 1,000 mg        1,000 mg Oral  Once 12/17/12 1924 12/17/12 2012          Assessment/Plan: Diverticulitis  Plan for conservative therapy underway.  She had fever last night, wbc normal today, her exam appears to be around the same, remainder of vitals ok.  I think she will likely need to go to or.  I discussed laparoscopic evaluation today with possible colectomy/colostomy also.  She asked about continuing another 24 hours on abx to see if she improves.  I think this is less likely at this point but there is not anything requiring trip to or today so I think this is not unreasonable.  I did tell her there is chance she could get worse and need emergent surgery but certainly if she was no better by tomorrow I would recommend surgery.  Will continue invanz, npo, foley catheter and hope she improves Replace k today, recheck labs in am.  [facility] 12/19/2012

## 2012-12-20 ENCOUNTER — Encounter (HOSPITAL_COMMUNITY): Admission: EM | Disposition: A | Discharge status: Home / Self Care | Payer: Self-pay | Source: Home / Self Care

## 2012-12-20 ENCOUNTER — Inpatient Hospital Stay (HOSPITAL_COMMUNITY): Payer: Self-pay | Admitting: Anesthesiology

## 2012-12-20 ENCOUNTER — Encounter (HOSPITAL_COMMUNITY): Payer: Self-pay | Admitting: Anesthesiology

## 2012-12-20 HISTORY — PX: COLOSTOMY: SHX63

## 2012-12-20 HISTORY — PX: LAPAROTOMY: SHX154

## 2012-12-20 HISTORY — PX: COLOSTOMY REVISION: SHX5232

## 2012-12-20 LAB — CBC
MCH: 28.4 pg (ref 26.0–34.0)
MCHC: 31.6 g/dL (ref 30.0–36.0)
MCV: 90.1 fL (ref 78.0–100.0)
Platelets: 181 10*3/uL (ref 150–400)

## 2012-12-20 LAB — BASIC METABOLIC PANEL
Calcium: 8.4 mg/dL (ref 8.4–10.5)
Creatinine, Ser: 0.62 mg/dL (ref 0.50–1.10)
GFR calc non Af Amer: >90 mL/min (ref >90)
Sodium: 136 mEq/L (ref 135–145)

## 2012-12-20 LAB — SURGICAL PCR SCREEN
MRSA, PCR: NEGATIVE
Staphylococcus aureus: NEGATIVE

## 2012-12-20 SURGERY — LAPAROTOMY, EXPLORATORY
Anesthesia: General | Site: Abdomen | Wound class: Dirty or Infected

## 2012-12-20 MED ORDER — LIDOCAINE HCL (CARDIAC) 20 MG/ML IV SOLN
INTRAVENOUS | Status: DC | PRN
  Administered 2012-12-20: 100 mg via INTRAVENOUS

## 2012-12-20 MED ORDER — OXYCODONE HCL 5 MG/5ML PO SOLN: 5.0000 mg | Freq: Once | ORAL | Status: AC | PRN

## 2012-12-20 MED ORDER — OXYCODONE HCL 5 MG PO TABS: 5.0000 mg | ORAL_TABLET | Freq: Once | ORAL | Status: AC | PRN

## 2012-12-20 MED ORDER — ROCURONIUM BROMIDE 100 MG/10ML IV SOLN
INTRAVENOUS | Status: DC | PRN
  Administered 2012-12-20: 50 mg via INTRAVENOUS

## 2012-12-20 MED ORDER — HYDROMORPHONE HCL PF 1 MG/ML IJ SOLN: 1.0000 mg | Freq: Once | INTRAMUSCULAR | Status: DC

## 2012-12-20 MED ORDER — SODIUM CHLORIDE 0.9 % IR SOLN
Status: DC | PRN
  Administered 2012-12-20: 12:00:00

## 2012-12-20 MED ORDER — MEPERIDINE HCL 25 MG/ML IJ SOLN: 6.2500 mg | INTRAMUSCULAR | Status: DC | PRN

## 2012-12-20 MED ORDER — LACTATED RINGERS IV SOLN
INTRAVENOUS | Status: DC | PRN
  Administered 2012-12-20 (×3): via INTRAVENOUS

## 2012-12-20 MED ORDER — NEOSTIGMINE METHYLSULFATE 1 MG/ML IJ SOLN
INTRAMUSCULAR | Status: DC | PRN
  Administered 2012-12-20: 3 mg via INTRAVENOUS

## 2012-12-20 MED ORDER — NALOXONE HCL 0.4 MG/ML IJ SOLN: 0.4000 mg | INTRAMUSCULAR | Status: DC | PRN

## 2012-12-20 MED ORDER — HYDROMORPHONE HCL PF 1 MG/ML IJ SOLN
0.2500 mg | INTRAMUSCULAR | Status: DC | PRN
  Administered 2012-12-20 (×6): 0.5 mg via INTRAVENOUS

## 2012-12-20 MED ORDER — DIPHENHYDRAMINE HCL 12.5 MG/5ML PO ELIX
12.5000 mg | ORAL_SOLUTION | Freq: Four times a day (QID) | ORAL | Status: DC | PRN
  Filled 2012-12-20: qty 5

## 2012-12-20 MED ORDER — VECURONIUM BROMIDE 10 MG IV SOLR
INTRAVENOUS | Status: DC | PRN
  Administered 2012-12-20: 2 mg via INTRAVENOUS

## 2012-12-20 MED ORDER — PROPOFOL 10 MG/ML IV BOLUS
INTRAVENOUS | Status: DC | PRN
  Administered 2012-12-20: 110 mg via INTRAVENOUS

## 2012-12-20 MED ORDER — GLYCOPYRROLATE 0.2 MG/ML IJ SOLN
INTRAMUSCULAR | Status: DC | PRN
  Administered 2012-12-20: .4 mg via INTRAVENOUS

## 2012-12-20 MED ORDER — FENTANYL CITRATE 0.05 MG/ML IJ SOLN
INTRAMUSCULAR | Status: DC | PRN
  Administered 2012-12-20 (×3): 50 ug via INTRAVENOUS
  Administered 2012-12-20: 100 ug via INTRAVENOUS
  Administered 2012-12-20: 50 ug via INTRAVENOUS
  Administered 2012-12-20: 100 ug via INTRAVENOUS
  Administered 2012-12-20 (×2): 50 ug via INTRAVENOUS
  Administered 2012-12-20: 100 ug via INTRAVENOUS
  Administered 2012-12-20 (×2): 50 ug via INTRAVENOUS
  Administered 2012-12-20: 100 ug via INTRAVENOUS

## 2012-12-20 MED ORDER — MIDAZOLAM HCL 5 MG/5ML IJ SOLN
INTRAMUSCULAR | Status: DC | PRN
  Administered 2012-12-20: 2 mg via INTRAVENOUS

## 2012-12-20 MED ORDER — ONDANSETRON HCL 4 MG/2ML IJ SOLN: 4.0000 mg | Freq: Once | INTRAMUSCULAR | Status: AC | PRN

## 2012-12-20 MED ORDER — 0.9 % SODIUM CHLORIDE (POUR BTL) OPTIME
TOPICAL | Status: DC | PRN
  Administered 2012-12-20: 7000 mL

## 2012-12-20 MED ORDER — ONDANSETRON HCL 4 MG/2ML IJ SOLN
4.0000 mg | Freq: Four times a day (QID) | INTRAMUSCULAR | Status: DC | PRN
  Administered 2012-12-24 – 2012-12-25 (×2): 4 mg via INTRAVENOUS
  Filled 2012-12-20 (×2): qty 2

## 2012-12-20 MED ORDER — DIPHENHYDRAMINE HCL 50 MG/ML IJ SOLN: 12.5000 mg | Freq: Four times a day (QID) | INTRAMUSCULAR | Status: DC | PRN

## 2012-12-20 MED ORDER — SODIUM CHLORIDE 0.9 % IJ SOLN: 9.0000 mL | INTRAMUSCULAR | Status: DC | PRN

## 2012-12-20 MED ORDER — SUCCINYLCHOLINE CHLORIDE 20 MG/ML IJ SOLN
INTRAMUSCULAR | Status: DC | PRN
  Administered 2012-12-20: 140 mg via INTRAVENOUS

## 2012-12-20 MED ORDER — HYDROMORPHONE 0.3 MG/ML IV SOLN
INTRAVENOUS | Status: DC
  Administered 2012-12-20: 17:00:00 via INTRAVENOUS
  Administered 2012-12-20: 1.11 mg via INTRAVENOUS
  Administered 2012-12-20 (×2): via INTRAVENOUS
  Administered 2012-12-21: 4.98 mg via INTRAVENOUS
  Administered 2012-12-21 (×5): via INTRAVENOUS
  Administered 2012-12-21: 3 mg via INTRAVENOUS
  Administered 2012-12-21: 10:00:00 via INTRAVENOUS
  Administered 2012-12-21: 0.999 mg via INTRAVENOUS
  Administered 2012-12-22: 06:00:00 via INTRAVENOUS
  Administered 2012-12-22: 6.3 mg via INTRAVENOUS
  Administered 2012-12-22: 6.88 mg via INTRAVENOUS
  Administered 2012-12-22: 7.69 mg via INTRAVENOUS
  Administered 2012-12-22 – 2012-12-23 (×3): via INTRAVENOUS
  Administered 2012-12-23: 11.47 mg via INTRAVENOUS
  Administered 2012-12-23 (×2): via INTRAVENOUS
  Administered 2012-12-23: 5.31 mg via INTRAVENOUS
  Administered 2012-12-23: 23:00:00 via INTRAVENOUS
  Administered 2012-12-24: 6.5 mg via INTRAVENOUS
  Administered 2012-12-24: 5.52 mg via INTRAVENOUS
  Filled 2012-12-20 (×21): qty 25

## 2012-12-20 MED ORDER — ONDANSETRON HCL 4 MG/2ML IJ SOLN
INTRAMUSCULAR | Status: DC | PRN
  Administered 2012-12-20: 4 mg via INTRAVENOUS

## 2012-12-20 SURGICAL SUPPLY — 53 items
BANDAGE GAUZE ELAST BULKY 4 IN (GAUZE/BANDAGES/DRESSINGS) ×1 IMPLANT
BLADE SURG ROTATE 9660 (MISCELLANEOUS) IMPLANT
CANISTER SUCTION 2500CC (MISCELLANEOUS) ×1 IMPLANT
CHLORAPREP W/TINT 26ML (MISCELLANEOUS) ×3 IMPLANT
CLOTH BEACON ORANGE TIMEOUT ST (SAFETY) ×3 IMPLANT
COVER MAYO STAND STRL (DRAPES) ×1 IMPLANT
COVER SURGICAL LIGHT HANDLE (MISCELLANEOUS) ×3 IMPLANT
DRAPE LAPAROSCOPIC ABDOMINAL (DRAPES) ×3 IMPLANT
DRAPE UTILITY 15X26 W/TAPE STR (DRAPE) ×7 IMPLANT
DRAPE WARM FLUID 44X44 (DRAPE) ×3 IMPLANT
ELECT BLADE 6.5 EXT (BLADE) ×1 IMPLANT
ELECT CAUTERY BLADE 6.4 (BLADE) ×3 IMPLANT
ELECT REM PT RETURN 9FT ADLT (ELECTROSURGICAL) ×3
ELECTRODE REM PT RTRN 9FT ADLT (ELECTROSURGICAL) ×2 IMPLANT
GLOVE BIO SURGEON STRL SZ7 (GLOVE) ×5 IMPLANT
GLOVE BIOGEL PI IND STRL 7.5 (GLOVE) ×2 IMPLANT
GLOVE BIOGEL PI INDICATOR 7.5 (GLOVE) ×2
GOWN STRL NON-REIN LRG LVL3 (GOWN DISPOSABLE) ×9 IMPLANT
KIT BASIN OR (CUSTOM PROCEDURE TRAY) ×3 IMPLANT
KIT COLOSTOMY ILEOSTOMY 2.75 (WOUND CARE) ×2 IMPLANT
KIT ROOM TURNOVER OR (KITS) ×3 IMPLANT
LIGASURE IMPACT 36 18CM CVD LR (INSTRUMENTS) ×2 IMPLANT
NS IRRIG 1000ML POUR BTL (IV SOLUTION) ×16 IMPLANT
PACK GENERAL/GYN (CUSTOM PROCEDURE TRAY) ×3 IMPLANT
PAD ARMBOARD 7.5X6 YLW CONV (MISCELLANEOUS) ×3 IMPLANT
PAD SHARPS MAGNETIC DISPOSAL (MISCELLANEOUS) IMPLANT
RELOAD PROXIMATE TA60MM BLUE (ENDOMECHANICALS) ×3 IMPLANT
RELOAD STAPLE 60 BLU REG PROX (ENDOMECHANICALS) IMPLANT
SPECIMEN JAR X LARGE (MISCELLANEOUS) IMPLANT
SPONGE GAUZE 4X4 12PLY (GAUZE/BANDAGES/DRESSINGS) ×1 IMPLANT
SPONGE LAP 18X18 X RAY DECT (DISPOSABLE) IMPLANT
STAPLER CUT CVD 40MM BLUE (STAPLE) ×1 IMPLANT
STAPLER GUN LINEAR PROX 60 (STAPLE) ×1 IMPLANT
STAPLER PROXIMATE 75MM BLUE (STAPLE) ×1 IMPLANT
STAPLER VISISTAT 35W (STAPLE) ×3 IMPLANT
SUCTION POOLE TIP (SUCTIONS) ×3 IMPLANT
SUT PDS AB 1 TP1 96 (SUTURE) ×6 IMPLANT
SUT PROLENE 2 0 CT2 30 (SUTURE) ×1 IMPLANT
SUT SILK 2 0 (SUTURE) ×3
SUT SILK 2 0 SH CR/8 (SUTURE) ×3 IMPLANT
SUT SILK 2-0 18XBRD TIE 12 (SUTURE) ×2 IMPLANT
SUT SILK 3 0 (SUTURE) ×3
SUT SILK 3 0 SH CR/8 (SUTURE) ×3 IMPLANT
SUT SILK 3-0 18XBRD TIE 12 (SUTURE) ×2 IMPLANT
SUT VIC AB 3-0 SH 27 (SUTURE) ×3
SUT VIC AB 3-0 SH 27X BRD (SUTURE) ×1 IMPLANT
SUT VIC AB 4-0 SH 18 (SUTURE) ×2 IMPLANT
SYR BULB IRRIGATION 50ML (SYRINGE) ×1 IMPLANT
TAPE CLOTH SURG 4X10 WHT LF (GAUZE/BANDAGES/DRESSINGS) ×1 IMPLANT
TOWEL OR 17X26 10 PK STRL BLUE (TOWEL DISPOSABLE) ×3 IMPLANT
TRAY FOLEY CATH 14FRSI W/METER (CATHETERS) IMPLANT
WATER STERILE IRR 1000ML POUR (IV SOLUTION) IMPLANT
YANKAUER SUCT BULB TIP NO VENT (SUCTIONS) ×1 IMPLANT

## 2012-12-20 NOTE — Op Note (Signed)
OPERATIVE REPORT  PREOPERATIVE DIAGNOSIS: Localized perforated diverticulitis, failure to progress.   POSTOPERATIVE DIAGNOSIS: Perforated diverticulitis with abscess and small bowel obstruction PROCEDURE PERFORMED:Sigmoid colectomy and end colostomy with drainage of intraabdominal abscess  SURGEON: [name], MD   ASSISTANT: Aris Georgia, PA-C  ANESTHESIA: General.   FINDINGS: Perforated sigmoid colon with significant purulent contamination  ESTIMATED BLOOD LOSS: 100 mL.   COMPLICATIONS: None known.   PROCEDURE:  Patient was identified in the holding area and taken to  the operating room where he was placed supine on the operating room  table. General anesthesia was induced. The abdomen was prepped and  draped in a sterile fashion. Time-out was performed according to the  surgical safety check list. When all was correct we continued. The  midline was incised and the subcutaneous tissues were divided with a  Bovie electrocautery. The fascia was opened in the midline.  Purulent fluid was aspirated from the abdomen. The sigmoid colon perforation was deep, and several adherent loops of small bowel were stuck down to the colon.   The Bookwalter retractor was used to facilitate  visibility. The white line of Toldt was taken down with the Bovie  Electrocautery. This dissection was carried out proximally along the descending colon.   The Contour stapler was used to divide the rectum, and the Ligasure was used to divide the mesocolon high up near the colon wall. The descending colon was divided with the GIA 75 proximal to the site of inflammation. The abdomen was then irrigated copiously with saline.   The right upper quadrant and right abdomen was also irrigated. Antibiotic irrigation was used as well.  Attention was directed to the left abdomen for a location for an  ostomy. Kochers were used to pull the fascia to the midline and an  another Zannie Cove was used to elevate the skin to create a  circular site.  A divot of fatty tissue was taken as well. The fascia was opened in a  cruciate fashion separating the longitudinal fibers of the rectus. A  lap was placed behind the fascia to ensure that the Bovie could not go  through and puncture any of the intra-abdominal organs. A Tanja Port was  then advanced through the abdominal wall and used to retract the stump  of the proximal transverse colon through the abdominal wall.   The abdomen was then closed using running #1 looped PDS sutures. The wound  was then irrigated and packed with moist Betadine-soaked Kerlix. The colon was then  opened with the cautery.   Interrupted 4-0 Vicryl sutures were used to create the ostomy. This was then dressed with a 2 piece wafer. The patient was then taken to the PACU in stable condition.

## 2012-12-20 NOTE — Anesthesia Preprocedure Evaluation (Addendum)
Anesthesia Evaluation  Patient identified by MRN, date of birth, ID band Patient awake    Reviewed: Allergy & Precautions, H&P , NPO status , Patient's Chart, lab work & pertinent test results  Airway Mallampati: I TM Distance: >3 FB Neck ROM: Full    Dental   Pulmonary          Cardiovascular     Neuro/Psych    GI/Hepatic   Endo/Other    Renal/GU      Musculoskeletal   Abdominal   Peds  Hematology   Anesthesia Other Findings   Reproductive/Obstetrics                           Anesthesia Physical Anesthesia Plan  ASA: II and emergent  Anesthesia Plan: General   Post-op Pain Management:    Induction: Intravenous, Rapid sequence and Cricoid pressure planned  Airway Management Planned: Oral ETT  Additional Equipment:   Intra-op Plan:   Post-operative Plan: Extubation in OR  Informed Consent: I have reviewed the patients History and Physical, chart, labs and discussed the procedure including the risks, benefits and alternatives for the proposed anesthesia with the patient or authorized representative who has indicated his/her understanding and acceptance.     Plan Discussed with: CRNA and Surgeon  Anesthesia Plan Comments:         Anesthesia Quick Evaluation  

## 2012-12-20 NOTE — Anesthesia Postprocedure Evaluation (Signed)
Anesthesia Post Note  Patient: Joanne Mora  Procedure(s) Performed: Procedure(s) (LRB): EXPLORATORY LAPAROTOMY (N/A) COLON RESECTION SIGMOID (N/A) COLOSTOMY (N/A)  Anesthesia type: general  Patient location: PACU  Post pain: Pain level controlled  Post assessment: Patient's Cardiovascular Status Stable  Last Vitals:  Filed Vitals:   12/20/12 1345  BP: 107/69  Pulse:   Temp:   Resp:     Post vital signs: Reviewed and stable  Level of consciousness: sedated  Complications: No apparent anesthesia complications

## 2012-12-20 NOTE — Transfer of Care (Signed)
Immediate Anesthesia Transfer of Care Note  Patient: Joanne Mora  Procedure(s) Performed: Procedure(s) (LRB) with comments: EXPLORATORY LAPAROTOMY (N/A) COLON RESECTION SIGMOID (N/A) COLOSTOMY (N/A)  Patient Location: PACU  Anesthesia Type:General  Level of Consciousness: awake, alert  and oriented  Airway & Oxygen Therapy: Patient Spontanous Breathing and Patient connected to nasal cannula oxygen  Post-op Assessment: Report given to PACU RN, Post -op Vital signs reviewed and stable and Patient moving all extremities  Post vital signs: Reviewed and stable  Complications: No apparent anesthesia complications

## 2012-12-20 NOTE — Addendum Note (Signed)
Addendum  created 12/20/12 1445 by Aubery Lapping, MD   Modules edited:Anesthesia Events

## 2012-12-20 NOTE — Addendum Note (Signed)
Addendum  created 12/20/12 1445 by Brizza Nathanson David Domenica Weightman, MD   Modules edited:Anesthesia Events    

## 2012-12-20 NOTE — Progress Notes (Signed)
Reviewed pt data and discussed surgery with pt.  Examined abdomen.  Will plan hartman's procedure (sigmoid colectomy with end ostomy).  Pt received Invanz at 2 am, still very therapeutic. Discussed ostomy and open skin incision.  Discussed risk of bleeding, infection, damage to adjacent structures, abscess.  Pt agrees to proceed.

## 2012-12-20 NOTE — Preoperative (Signed)
Beta Blockers   Reason not to administer Beta Blockers:Not Applicable 

## 2012-12-20 NOTE — Progress Notes (Signed)
  Subjective: Abdominal pain worse, more distended, no vomiting, no flatus  Objective: Vital signs in last 24 hours: Temp:  [97.8 F (36.6 C)-98.9 F (37.2 C)] 98 F (36.7 C) (01/26 0517) Pulse Rate:  [90-103] 93  (01/26 0517) Resp:  [17-19] 17  (01/26 0517) BP: (96-116)/(51-59) 110/58 mmHg (01/26 0517) SpO2:  [92 %-100 %] 100 % (01/26 0517) Last BM Date: 12/17/12  Intake/Output from previous day: 01/25 0701 - 01/26 0700 In: 1731 [I.V.:1731] Out: 2150 [Urine:2150] Intake/Output this shift:    General appearance: mild distress GI: tender throughout abdomen, no bs  Lab Results:   [facility] 12/18/12 0718 12/17/12 1651  WBC 8.4 5.9  HGB 10.5* 12.2  HCT 33.0* 37.7  PLT 211 225   BMET  Basename 12/18/12 0718 12/17/12 1651  NA 136 138  K 3.4* 3.8  CL 101 102  CO2 22 22  GLUCOSE 119* 132*  BUN 8 10  CREATININE 0.57 0.60  CALCIUM 8.2* 8.8     Anti-infectives: Anti-infectives     Start     Dose/Rate Route Frequency Ordered Stop   12/18/12 0200   ertapenem (INVANZ) 1 g in sodium chloride 0.9 % 50 mL IVPB        1 g 100 mL/hr over 30 Minutes Intravenous Every 24 hours 12/18/12 0118     12/17/12 2215   ciprofloxacin (CIPRO) IVPB 400 mg        400 mg 200 mL/hr over 60 Minutes Intravenous  Once 12/17/12 2204 12/18/12 0018   12/17/12 2215   metroNIDAZOLE (FLAGYL) IVPB 500 mg        500 mg 100 mL/hr over 60 Minutes Intravenous  Once 12/17/12 2204 12/17/12 2309   12/17/12 1930   cefTRIAXone (ROCEPHIN) injection 250 mg        250 mg Intramuscular  Once 12/17/12 1924 12/17/12 2011   12/17/12 1930   azithromycin (ZITHROMAX) tablet 1,000 mg        1,000 mg Oral  Once 12/17/12 1924 12/17/12 2012          Assessment/Plan: Complicated diverticulitis with sbo  No better on exam today.  She has obstruction and worse exam.  She needs to go to or.  I discussed elap with sigmoid colectomy and a colostomy.  Risks include but are not limited to bleeding, infection, postop  infection requiring drainage with ct scan or repeat operation, injury to surrounding structures, open wound, dvt/pe, uti, pna etc.  She understands this is another week or so in hospital given preop state.  Understands colostomy should be temporary also but would be at least 4 months before considering takedown.  She agrees to proceed  [facility] 12/20/2012

## 2012-12-20 NOTE — Progress Notes (Signed)
After having paged MD on-call, realized pt. pain med was 1- 2mg . not 1mg . as had been thought. Once pt. rec'd .max. Dose, pain much better controlled. No ords. written

## 2012-12-21 ENCOUNTER — Encounter (HOSPITAL_COMMUNITY): Payer: Self-pay | Admitting: General Surgery

## 2012-12-21 LAB — CBC
Hemoglobin: 10.9 g/dL — ABNORMAL LOW (ref 12.0–15.0)
MCH: 28 pg (ref 26.0–34.0)
MCHC: 31.6 g/dL (ref 30.0–36.0)
MCV: 88.7 fL (ref 78.0–100.0)
Platelets: 237 10*3/uL (ref 150–400)
RBC: 3.89 MIL/uL (ref 3.87–5.11)

## 2012-12-21 LAB — BASIC METABOLIC PANEL
CO2: 26 mEq/L (ref 19–32)
Calcium: 8 mg/dL — ABNORMAL LOW (ref 8.4–10.5)
Creatinine, Ser: 0.67 mg/dL (ref 0.50–1.10)
GFR calc non Af Amer: >90 mL/min (ref >90)
Glucose, Bld: 137 mg/dL — ABNORMAL HIGH (ref 70–99)

## 2012-12-21 MED ORDER — KETOROLAC TROMETHAMINE 30 MG/ML IJ SOLN
30.0000 mg | Freq: Four times a day (QID) | INTRAMUSCULAR | Status: AC
  Administered 2012-12-21 – 2012-12-23 (×8): 30 mg via INTRAVENOUS
  Filled 2012-12-21 (×9): qty 1

## 2012-12-21 NOTE — Progress Notes (Addendum)
Spoke with Aris Georgia PA with surgery about putting VAC on patient earlier this afternoon. Rn ordered dressing supplies and VAC. Rn did not realize that orders were never placed for VAC until now. Rn called MD on call about orders for VAC. Was told to continue with wet to dry dressing changes until in the morning so that [facility] nurse can put first initial dressing on. Rn will make sure information is passed on for tomorrow to carry out orders.

## 2012-12-21 NOTE — Progress Notes (Signed)
As of now pt. has used 22.5mg  Dilaudid/PCA and continues to report pain @8 Tomma Lightning f/u with MD

## 2012-12-21 NOTE — Consult Note (Signed)
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  Pt feeling poorly and in pain.  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  Supplies ordered to room and will begin teaching sessions when stable and feeling better.  Cammie Mcgee, RN, MSN, [employer]  [phone]

## 2012-12-21 NOTE — Progress Notes (Signed)
Orthopedic Tech Progress Note Patient Details:  Joanne Mora 25-May-1966 [id number] Abdominal binder delivered to [facility] nursing station with instructions to nurse secretary to give to nurse for room 9.  Ortho Devices Type of Ortho Device: Abdominal binder Ortho Device/Splint Interventions: Ordered   [place] R Thompson 12/21/2012, 1:40 PM

## 2012-12-21 NOTE — Progress Notes (Signed)
Patient refused bath today.

## 2012-12-21 NOTE — Care Management Note (Signed)
  Page 1 of 1   12/21/2012     3:55:40 PM   CARE MANAGEMENT NOTE 12/21/2012  Patient:  Joanne Mora, Joanne Mora   Account Number:  [id number]  Date Initiated:  12/21/2012  Documentation initiated by:  Ronny Flurry  Subjective/Objective Assessment:   Sigmoid diverticulitis with small perforation and scattered free intraperitoneal air - s/p Sigmoid colectomy and end colostomy with drainage of intraabdominal abscess  1.  Cont NPO, IVF, NG tube     Action/Plan:   Anticipated DC Date:     Anticipated DC Plan:  HOME W HOME HEALTH SERVICES         Choice offered to / List presented to:  C-1 Patient   DME arranged  [facility]      DME agency  [facility]     [facility] arranged  HH-1 RN      [facility] agency  [facility]   Status of service:   Medicare Important Message given?   (If response is "NO", the following Medicare IM given date fields will be blank) Date Medicare IM given:   Date Additional Medicare IM given:    Discharge Disposition:    Per UR Regulation:  Reviewed for med. necessity/level of care/duration of stay  If discussed at Long Length of Stay Meetings, dates discussed:    Comments:  12-21-12 Face sheet information confirmed . Ronny Flurry RN BSn [phone]

## 2012-12-21 NOTE — Progress Notes (Signed)
Ostomy viable Begin dressing changes Add Toradol OOB to chair  Continue Foley until tomorrow.  Joanne Mora. Corliss Skains, MD, [facility]  12/21/2012 9:46 AM

## 2012-12-21 NOTE — Progress Notes (Signed)
1 Day Post-Op  Subjective: Pt in a lot of abdominal pain this morning.  Pt with NG tube in place ( output) and PCA.  Pt denies gas/BM through bag, is urinating through catheter.  Objective: Vital signs in last 24 hours: Temp:  [98.9 F (37.2 C)-99.6 F (37.6 C)] 98.9 F (37.2 C) (01/27 0550) Pulse Rate:  [92-110] 104  (01/27 0550) Resp:  [13-20] 20  (01/27 0550) BP: (102-123)/(51-69) 102/57 mmHg (01/27 0550) SpO2:  [92 %-98 %] 98 % (01/27 0550) Last BM Date: 12/17/12  Intake/Output from previous day: 01/26 0701 - 01/27 0700 In: 3871 [I.V.:3871] Out: 1875 [Urine:1375; Emesis/NG output:500] Intake/Output this shift:    PE: Gen:  Alert, NAD, pleasant Card:  RRR, no M/G/R heard Pulm:  CTA, no W/R/R Abd: Soft, very TTP, voluntary guarding, distended, decreased BS, ostomy appears dusky or may just be covered with sanguinous output, some sanguinous output in bag   Lab Results:   Basename 12/21/12 0555 12/20/12 0750  WBC 8.3 8.5  HGB 10.9* 9.5*  HCT 34.5* 30.1*  PLT 237 181   BMET  Basename 12/21/12 0555 12/20/12 0750  NA 136 136  K 4.3 3.7  CL 101 103  CO2 26 24  GLUCOSE 137* 132*  BUN 7 5*  CREATININE 0.67 0.62  CALCIUM 8.0* 8.4   PT/INR No results found for this basename: LABPROT:2,INR:2 in the last 72 hours CMP     Component Value Date/Time   NA 136 12/21/2012 0555   K 4.3 12/21/2012 0555   CL 101 12/21/2012 0555   CO2 26 12/21/2012 0555   GLUCOSE 137* 12/21/2012 0555   BUN 7 12/21/2012 0555   CREATININE 0.67 12/21/2012 0555   CALCIUM 8.0* 12/21/2012 0555   PROT 7.8 12/17/2012 1651   ALBUMIN 3.3* 12/17/2012 1651   AST 10 12/17/2012 1651   ALT 7 12/17/2012 1651   ALKPHOS 62 12/17/2012 1651   BILITOT 0.2* 12/17/2012 1651   GFRNONAA >90 12/21/2012 0555   GFRAA >90 12/21/2012 0555   Lipase     Component Value Date/Time   LIPASE 12 12/17/2012 1651       Studies/Results: No results found.  Anti-infectives: Anti-infectives     Start     Dose/Rate Route  Frequency Ordered Stop   12/20/12 1210   polymyxin B 500,000 Units, bacitracin 50,000 Units in sodium chloride irrigation 0.9 % 500 mL irrigation  Status:  Discontinued          As needed 12/20/12 1210 12/20/12 1316   12/18/12 0200   ertapenem (INVANZ) 1 g in sodium chloride 0.9 % 50 mL IVPB        1 g 100 mL/hr over 30 Minutes Intravenous Every 24 hours 12/18/12 0118     12/17/12 2215   ciprofloxacin (CIPRO) IVPB 400 mg        400 mg 200 mL/hr over 60 Minutes Intravenous  Once 12/17/12 2204 12/18/12 0018   12/17/12 2215   metroNIDAZOLE (FLAGYL) IVPB 500 mg        500 mg 100 mL/hr over 60 Minutes Intravenous  Once 12/17/12 2204 12/17/12 2309   12/17/12 1930   cefTRIAXone (ROCEPHIN) injection 250 mg        250 mg Intramuscular  Once 12/17/12 1924 12/17/12 2011   12/17/12 1930   azithromycin (ZITHROMAX) tablet 1,000 mg        1,000 mg Oral  Once 12/17/12 1924 12/17/12 2012           Assessment/Plan Sigmoid  diverticulitis with small perforation and scattered free intraperitoneal air - s/p Sigmoid colectomy and end colostomy with drainage of intraabdominal abscess 1.  Cont NPO, IVF, NG tube 2.  Cont PCA, patient requiring a lot of dilaudid 3.  Await bowel function to return then start clears and d/c NG tube 4.  Catheter likely needs to stay in today due to patients pain level with movement 5.  Start PT/OT maybe tomorrow to get the patient moving 6.  WOC RN following  ABL Anemia - stable/improved   LOS: 4 days    Joanne Mora, Joanne Mora 12/21/2012, 8:26 AM Pager: [phone]

## 2012-12-22 LAB — URINE MICROSCOPIC-ADD ON

## 2012-12-22 LAB — CBC
MCH: 28.4 pg (ref 26.0–34.0)
MCHC: 31.4 g/dL (ref 30.0–36.0)
MCV: 90.2 fL (ref 78.0–100.0)
Platelets: 215 10*3/uL (ref 150–400)

## 2012-12-22 LAB — URINALYSIS, ROUTINE W REFLEX MICROSCOPIC
Glucose, UA: NEGATIVE mg/dL
Hgb urine dipstick: NEGATIVE
Ketones, ur: 15 mg/dL — AB
Protein, ur: 30 mg/dL — AB
Urobilinogen, UA: 0.2 mg/dL (ref 0.0–1.0)

## 2012-12-22 LAB — BASIC METABOLIC PANEL
Calcium: 8.1 mg/dL — ABNORMAL LOW (ref 8.4–10.5)
Creatinine, Ser: 0.56 mg/dL (ref 0.50–1.10)
GFR calc non Af Amer: >90 mL/min (ref >90)
Glucose, Bld: 112 mg/dL — ABNORMAL HIGH (ref 70–99)
Sodium: 142 mEq/L (ref 135–145)

## 2012-12-22 NOTE — Progress Notes (Signed)
2 Days Post-Op  Subjective: Pt feeling much better today, still with moderate abdominal pain, but she is relying less on the PCA than yesterday.  Pt denies N/V.  No gas or BM in bag yet.  Pt will work with PT/OT today.  Objective: Vital signs in last 24 hours: Temp:  [98 F (36.7 C)-98.8 F (37.1 C)] 98.4 F (36.9 C) (01/28 0700) Pulse Rate:  [81-104] 81  (01/28 0700) Resp:  [14-22] 14  (01/28 0700) BP: (107-120)/(44-76) 108/44 mmHg (01/28 0700) SpO2:  [94 %-100 %] 95 % (01/28 0700) Last BM Date:  (preop)  Intake/Output from previous day: 01/27 0701 - 01/28 0700 In: 3486 [I.V.:3386; NG/GT:100] Out: 2460 [Urine:1500; Emesis/NG output:960] Intake/Output this shift:    PE: Gen:  Alert, NAD, pleasant Abd: Soft, moderate TTP, distended, decreased BS, ostomy viable with sanguinous output    Lab Results:   Basename 12/21/12 0555 12/20/12 0750  WBC 8.3 8.5  HGB 10.9* 9.5*  HCT 34.5* 30.1*  PLT 237 181   BMET  Basename 12/21/12 0555 12/20/12 0750  NA 136 136  K 4.3 3.7  CL 101 103  CO2 26 24  GLUCOSE 137* 132*  BUN 7 5*  CREATININE 0.67 0.62  CALCIUM 8.0* 8.4   PT/INR No results found for this basename: LABPROT:2,INR:2 in the last 72 hours CMP     Component Value Date/Time   NA 136 12/21/2012 0555   K 4.3 12/21/2012 0555   CL 101 12/21/2012 0555   CO2 26 12/21/2012 0555   GLUCOSE 137* 12/21/2012 0555   BUN 7 12/21/2012 0555   CREATININE 0.67 12/21/2012 0555   CALCIUM 8.0* 12/21/2012 0555   PROT 7.8 12/17/2012 1651   ALBUMIN 3.3* 12/17/2012 1651   AST 10 12/17/2012 1651   ALT 7 12/17/2012 1651   ALKPHOS 62 12/17/2012 1651   BILITOT 0.2* 12/17/2012 1651   GFRNONAA >90 12/21/2012 0555   GFRAA >90 12/21/2012 0555   Lipase     Component Value Date/Time   LIPASE 12 12/17/2012 1651       Studies/Results: No results found.  Anti-infectives: Anti-infectives     Start     Dose/Rate Route Frequency Ordered Stop   12/20/12 1210   polymyxin B 500,000 Units, bacitracin  50,000 Units in sodium chloride irrigation 0.9 % 500 mL irrigation  Status:  Discontinued          As needed 12/20/12 1210 12/20/12 1316   12/18/12 0200   ertapenem (INVANZ) 1 g in sodium chloride 0.9 % 50 mL IVPB        1 g 100 mL/hr over 30 Minutes Intravenous Every 24 hours 12/18/12 0118     12/17/12 2215   ciprofloxacin (CIPRO) IVPB 400 mg        400 mg 200 mL/hr over 60 Minutes Intravenous  Once 12/17/12 2204 12/18/12 0018   12/17/12 2215   metroNIDAZOLE (FLAGYL) IVPB 500 mg        500 mg 100 mL/hr over 60 Minutes Intravenous  Once 12/17/12 2204 12/17/12 2309   12/17/12 1930   cefTRIAXone (ROCEPHIN) injection 250 mg        250 mg Intramuscular  Once 12/17/12 1924 12/17/12 2011   12/17/12 1930   azithromycin (ZITHROMAX) tablet 1,000 mg        1,000 mg Oral  Once 12/17/12 1924 12/17/12 2012           Assessment/Plan Sigmoid diverticulitis with small perforation and scattered free intraperitoneal air - s/p Sigmoid  colectomy and end colostomy with drainage of intraabdominal abscess  1. Cont NPO, IVF, NG tube  2. Cont PCA, patient requiring less dilaudid than yesterday, goal to d/c PCA and start PRN meds tomorrow 3. Await bowel function to return then start clears and d/c NG tube  4. Darker urine - D/C catheter now that patient can move around more, and check UA 5. Start PT/OT maybe today to get the patient moving  6. WOC RN following   ABL Anemia - stable/improved, repeat cbc today     LOS: 5 days    DORT, Damien Cisar 12/22/2012, 8:05 AM Pager: [phone]

## 2012-12-22 NOTE — Progress Notes (Signed)
VAC to midline wound Ostomy education Clamp NG tube - some flatus in bag  PT/OT Wilmon Arms. Corliss Skains, MD, [facility]  12/22/2012 9:35 AM

## 2012-12-22 NOTE — Evaluation (Signed)
Physical Therapy Evaluation Patient Details Name: Joanne Mora MRN: [id number] DOB: 24-Dec-1965 Today's Date: 12/22/2012 Time: 1191-4782 PT Time Calculation (min): 26 min  PT Assessment / Plan / Recommendation Clinical Impression  Pt is a pleasent 47 y.o. female s/p perforated colon.  Patient moving well despite pain and immobility. Patient does present with modest deficits secondary to pain and decreased activity tolerance. Anticipate patient will progress well. Will continue to see acutely to address deficits and maximize independence for d/c home when medically stable.    PT Assessment  Patient needs continued PT services    Follow Up Recommendations  No PT follow up          Equipment Recommendations  Rolling walker with 5" wheels (3 in 1)    Recommendations for Other Services     Frequency Min 3X/week    Precautions / Restrictions Restrictions Weight Bearing Restrictions: No   Pertinent Vitals/Pain 5/10      Mobility  Bed Mobility Bed Mobility: Supine to Sit;Sitting - Scoot to Edge of Bed Supine to Sit: 4: Min assist Sitting - Scoot to [place] of Bed: 6: Modified independent (Device/Increase time) Details for Bed Mobility Assistance: Initial assist to pull to sit secondary to pain Transfers Transfers: Sit to Stand;Stand to Sit Sit to Stand: 5: Supervision;From bed;From toilet Stand to Sit: 5: Supervision;To toilet;To chair/3-in-1 Ambulation/Gait Ambulation/Gait Assistance: 4: Min guard Ambulation Distance (Feet): 20 Feet Assistive device: None Ambulation/Gait Assistance Details: Min guard to manage lines Gait Pattern: Within Functional Limits Gait velocity: decreased General Gait Details: steady with gait           PT Diagnosis: Acute pain  PT Problem List: Decreased strength;Decreased range of motion;Decreased activity tolerance;Decreased balance;Decreased mobility;Pain PT Treatment Interventions: DME instruction;Gait training;Stair training;Functional  mobility training;Therapeutic activities;Therapeutic exercise;Patient/family education   PT Goals Acute Rehab PT Goals PT Goal Formulation: With patient Time For Goal Achievement: 12/29/12 Potential to Achieve Goals: Good Pt will go Supine/Side to Sit: with modified independence PT Goal: Supine/Side to Sit - Progress: Goal set today Pt will Sit at Edge of Bed: with modified independence PT Goal: Sit at Edge Of Bed - Progress: Goal set today Pt will go Sit to Supine/Side: with modified independence PT Goal: Sit to Supine/Side - Progress: Goal set today Pt will go Sit to Stand: with modified independence PT Goal: Sit to Stand - Progress: Goal set today Pt will go Stand to Sit: with modified independence PT Goal: Stand to Sit - Progress: Goal set today Pt will Ambulate: >150 feet;with modified independence PT Goal: Ambulate - Progress: Goal set today Pt will Go Up / Down Stairs: 3-5 stairs;with modified independence PT Goal: Up/Down Stairs - Progress: Goal set today  Visit Information  Last PT Received On: 12/22/12 Assistance Needed: +1    Subjective Data  Subjective: I would like to use the bathroom Patient Stated Goal: to go home   Prior Functioning  Home Living Lives With: Family Available Help at Discharge: Available PRN/intermittently Type of Home: House Home Access: Stairs to enter [employer] of Steps: 5 Entrance Stairs-Rails: Right;Left;Can reach both Home Layout: One level Bathroom Shower/Tub: Engineer, manufacturing systems: Standard Home Adaptive Equipment: Environmental consultant - rolling;Straight cane Prior Function Level of Independence: Independent Able to Take Stairs?: Yes Driving: Yes 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) Communication Communication: No difficulties Dominant Hand: Right    Cognition  Overall Cognitive Status: Appears within functional limits for tasks assessed/performed Arousal/Alertness: Awake/alert Orientation Level:  Appears intact for tasks  assessed;Oriented X4 / Intact Behavior During Session: WFL for tasks performed    Extremity/Trunk Assessment Right Upper Extremity Assessment RUE ROM/Strength/Tone: Within functional levels RUE Sensation: WFL - Light Touch;WFL - Proprioception RUE Coordination: WFL - gross/fine motor Left Upper Extremity Assessment LUE ROM/Strength/Tone: Within functional levels LUE Sensation: WFL - Light Touch;WFL - Proprioception LUE Coordination: WFL - gross/fine motor Right Lower Extremity Assessment RLE ROM/Strength/Tone: Within functional levels RLE Sensation: WFL - Light Touch;WFL - Proprioception RLE Coordination: WFL - gross/fine motor Left Lower Extremity Assessment LLE ROM/Strength/Tone: Within functional levels LLE Sensation: WFL - Light Touch;WFL - Proprioception LLE Coordination: WFL - gross/fine motor Trunk Assessment Trunk Assessment: Normal   Balance    End of Session PT - End of Session Equipment Utilized During Treatment: Gait belt (wound vac, ng tube) Activity Tolerance: Patient tolerated treatment well Patient left: in chair;with call bell/phone within reach;with nursing in room Nurse Communication: Mobility status  GP     Fabio Asa 12/22/2012, 4:41 PM

## 2012-12-22 NOTE — Consult Note (Signed)
WOC consult Note Reason for Consult: Requested to apply abd vac dressing. Wound type: Full thickness post-op wound Measurement:21X6X5cm Wound bed: 80% beefy red, 20% yellow adipose Drainage (amount, consistency, odor) Small pink drainage, no odor. Periwound: Intact skin surrounding. Dressing procedure/placement/frequency: One piece black sponge applied to 125 mm cont suction.  Pt medicated for pain prior to procedure and tolerated well with minimal discomfort. Plan for dressing change Thursday.  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 Current pouch leaking. Stomal assessment/size: Stoma red and viable, flush with skin level, 1inch. Peristomal assessment: Skin intact surrounding. Output scant amt blood-tinged drainage. Ostomy pouching: 2pc, pt prefers this type of appliance.  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  Applied 2 piece with barrier ring.  Discussed ordering supplies after discharge; ordered additional pouching supplies for staff nurses. Educational materials left at bedside for patient.   Cammie Mcgee, RN, MSN, [employer]  [phone]

## 2012-12-23 LAB — CBC
HCT: 26.4 % — ABNORMAL LOW (ref 36.0–46.0)
MCH: 28.7 pg (ref 26.0–34.0)
MCV: 90.1 fL (ref 78.0–100.0)
Platelets: 219 10*3/uL (ref 150–400)
RBC: 2.93 MIL/uL — ABNORMAL LOW (ref 3.87–5.11)

## 2012-12-23 MED ORDER — ACETAMINOPHEN 10 MG/ML IV SOLN
1000.0000 mg | Freq: Four times a day (QID) | INTRAVENOUS | Status: AC
  Administered 2012-12-23 – 2012-12-24 (×3): 1000 mg via INTRAVENOUS
  Filled 2012-12-23 (×4): qty 100

## 2012-12-23 NOTE — Progress Notes (Signed)
Leave NG for now until some bowel activity.  Clamp/ return to suction if nauseated Sips of clears to wet her mouth Ambulate VAC to midline  Wilmon Arms. Corliss Skains, MD, [facility]  12/23/2012 10:33 AM

## 2012-12-23 NOTE — Progress Notes (Signed)
Physical Therapy Treatment Patient Details Name: Joanne Mora MRN: [id number] DOB: 1966-09-29 Today's Date: 12/23/2012 Time: 1211-1237 PT Time Calculation (min): 26 min  PT Assessment / Plan / Recommendation Comments on Treatment Session  Pt making steady progress towards PT goals despite increased pain today. Pt concerned over current pain medications stating that they are not adequately managing her pain unlike yesterday.  Will continue to progress activity as tolerated.    Follow Up Recommendations  No PT follow up     Does the patient have the potential to tolerate intense rehabilitation     Barriers to Discharge        Equipment Recommendations  Rolling walker with 5" wheels (3 in 1)    Recommendations for Other Services    Frequency Min 3X/week   Plan Discharge plan remains appropriate    Precautions / Restrictions Restrictions Weight Bearing Restrictions: No   Pertinent Vitals/Pain 8/10    Mobility  Bed Mobility Bed Mobility: Supine to Sit;Sitting - Scoot to Edge of Bed Supine to Sit: 4: Min guard;HOB elevated Sitting - Scoot to Edge of Bed: 6: Modified independent (Device/Increase time) Transfers Sit to Stand: 5: Supervision;From bed;With upper extremity assist Stand to Sit: 5: Supervision;To chair/3-in-1;With upper extremity assist Ambulation/Gait Ambulation/Gait Assistance: 5: Supervision Ambulation Distance (Feet): 200 Feet Assistive device: None Ambulation/Gait Assistance Details: Patient used IV pole for ambulation, very steady despite pain, occassional moaning while ambulating Gait Pattern: Within Functional Limits Gait velocity: decreased General Gait Details: steady with gait      PT Goals Acute Rehab PT Goals PT Goal: Supine/Side to Sit - Progress: Progressing toward goal PT Goal: Sit at Edge Of Bed - Progress: Progressing toward goal PT Goal: Sit to Stand - Progress: Progressing toward goal PT Goal: Stand to Sit - Progress: Progressing  toward goal PT Goal: Ambulate - Progress: Progressing toward goal  Visit Information  Last PT Received On: 12/23/12 Assistance Needed: +1    Subjective Data  Subjective: I am upset that they changed my pain medicine Patient Stated Goal: to go home   Cognition  Overall Cognitive Status: Appears within functional limits for tasks assessed/performed Arousal/Alertness: Awake/alert Orientation Level: Appears intact for tasks assessed;Oriented X4 / Intact Behavior During Session: [facility] for tasks performed    Balance  Balance Balance Assessed: Yes Static Sitting Balance Static Sitting - Balance Support: Feet supported;No upper extremity supported Static Sitting - Level of Assistance: 7: Independent Static Standing Balance Static Standing - Balance Support: No upper extremity supported Static Standing - Level of Assistance: 5: Stand by assistance  End of Session PT - End of Session Equipment Utilized During Treatment: Gait belt (wound vac, ng tube) Activity Tolerance: Patient tolerated treatment well Patient left: in chair;with call bell/phone within reach;with nursing in room Nurse Communication: Mobility status   GP     Fabio Asa 12/23/2012, 3:28 PM Charlotte Crumb, PT DPT  [phone]

## 2012-12-23 NOTE — Evaluation (Signed)
Occupational Therapy Evaluation Patient Details Name: Joanne Mora MRN: [id number] DOB: 1966-03-28 Today's Date: 12/23/2012 Time: 2725-3664 OT Time Calculation (min): 26 min  OT Assessment / Plan / Recommendation Clinical Impression  Pt admitted with abdominal pain and now s/p Sigmoid colectomy and end colostomy with drainage of intraabdominal abscess.  Pt will benefit from acute OT services to address below problem list in prep for return home with PRN assist from family.    OT Assessment  Patient needs continued OT Services    Follow Up Recommendations  Supervision - Intermittent;No OT follow up    Barriers to Discharge None    Equipment Recommendations  None recommended by OT    Recommendations for Other Services    Frequency  Min 2X/week    Precautions / Restrictions Restrictions Weight Bearing Restrictions: No   Pertinent Vitals/Pain See vitals    ADL  Grooming: Performed;Wash/dry face;Set up Where Assessed - Grooming: Unsupported sitting Upper Body Bathing: Simulated;Set up Where Assessed - Upper Body Bathing: Unsupported sitting Lower Body Bathing: Simulated;Minimal assistance Where Assessed - Lower Body Bathing: Unsupported sitting Upper Body Dressing: Simulated;Set up Where Assessed - Upper Body Dressing: Unsupported sitting Lower Body Dressing: Simulated;Minimal assistance Where Assessed - Lower Body Dressing: Unsupported sitting Toilet Transfer: Simulated;Supervision/safety Toilet Transfer Method: Sit to stand Toilet Transfer Equipment: Other (comment) (bed to ambulate in hallway and return to chair in room) Equipment Used: Gait belt Transfers/Ambulation Related to ADLs: close superivision for safety and line management.  Pt required no physical assist only increased time due to abdominal pain. ADL Comments: increased time due to pain    OT Diagnosis: Generalized weakness;Acute pain  OT Problem List: Decreased strength;Decreased activity  tolerance;Pain;Decreased knowledge of use of DME or AE;Obesity OT Treatment Interventions: Self-care/ADL training;DME and/or AE instruction;Therapeutic activities;Patient/family education   OT Goals Acute Rehab OT Goals OT Goal Formulation: With patient Time For Goal Achievement: 12/30/12 Potential to Achieve Goals: Good ADL Goals Pt Will Perform Grooming: with modified independence;Standing at sink ADL Goal: Grooming - Progress: Goal set today Pt Will Perform Lower Body Bathing: with modified independence;Sit to stand from chair;Sit to stand from bed;with adaptive equipment ADL Goal: Lower Body Bathing - Progress: Goal set today Pt Will Perform Lower Body Dressing: with modified independence;Sit to stand from chair;Sit to stand from bed;with adaptive equipment ADL Goal: Lower Body Dressing - Progress: Goal set today Miscellaneous OT Goals Miscellaneous OT Goal #1: Pt will perform all functional mobility at mod I level. OT Goal: Miscellaneous Goal #1 - Progress: Goal set today  Visit Information  Last OT Received On: 12/23/12 Assistance Needed: +1    Subjective Data      Prior Functioning     Home Living Lives With: Family Available Help at Discharge: Available PRN/intermittently Type of Home: House Home Access: Stairs to enter [employer] of Steps: 5 Entrance Stairs-Rails: Right;Left;Can reach both Home Layout: One level Bathroom Shower/Tub: Engineer, manufacturing systems: Standard Home Adaptive Equipment: Environmental consultant - rolling;Straight cane Prior Function Level of Independence: Independent Able to Take Stairs?: Yes Driving: Yes 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 Communication: No difficulties Dominant Hand: Right         Vision/Perception     Cognition  Overall Cognitive Status: Appears within functional limits for tasks assessed/performed Arousal/Alertness: Awake/alert Orientation Level: Appears intact for tasks  assessed;Oriented X4 / Intact Behavior During Session: [facility] for tasks performed    Extremity/Trunk Assessment Right Upper Extremity Assessment RUE ROM/Strength/Tone: Within functional levels Left Upper Extremity Assessment  LUE ROM/Strength/Tone: Within functional levels     Mobility Bed Mobility Bed Mobility: Supine to Sit;Sitting - Scoot to Edge of Bed Supine to Sit: 4: Min guard;HOB elevated Sitting - Scoot to Edge of Bed: 6: Modified independent (Device/Increase time) Transfers Transfers: Sit to Stand;Stand to Sit Sit to Stand: 5: Supervision;From bed;With upper extremity assist Stand to Sit: 5: Supervision;To chair/3-in-1;With upper extremity assist     Shoulder Instructions     Exercise     Balance Balance Balance Assessed: Yes Static Sitting Balance Static Sitting - Balance Support: Feet supported;No upper extremity supported Static Sitting - Level of Assistance: 7: Independent Static Standing Balance Static Standing - Balance Support: No upper extremity supported Static Standing - Level of Assistance: 5: Stand by assistance   End of Session OT - End of Session Equipment Utilized During Treatment: Gait belt Activity Tolerance: Patient limited by pain Patient left: in chair;with call bell/phone within reach Nurse Communication: Mobility status  GO   12/23/2012 Cipriano Mile OTR/L Pager [phone] Office [phone]   Cipriano Mile 12/23/2012, 3:11 PM

## 2012-12-23 NOTE — Progress Notes (Signed)
3 Days Post-Op  Subjective: Very anxious, doesn't want PCA turned off, does not want NG out, not moving much, wants to know if she can eat some,  Nothing in her ostomy bag. Objective: Vital signs in last 24 hours: Temp:  [98 F (36.7 C)-98.7 F (37.1 C)] 98.7 F (37.1 C) (01/29 0507) Pulse Rate:  [78-94] 78  (01/29 0507) Resp:  [13-20] 17  (01/29 0513) BP: (102-123)/(51-63) 117/61 mmHg (01/29 0507) SpO2:  [94 %-99 %] 99 % (01/29 0513) Last BM Date:  (colostomy)  500 ml per NG recorded, no BM, afebrile, VSS, mild anemia, WBC is normal NPO X ICE CHIPS  6.8 liter + since admit Intake/Output from previous day: 01/28 0701 - 01/29 0700 In: 4070 [P.O.:420; I.V.:3450; IV Piggyback:200] Out: 675 [Urine:150; Emesis/NG output:500; Drains:25] Intake/Output this shift:    General appearance: alert, cooperative and no distress Resp: clear to auscultation bilaterally GI: soft, tender, still complaining of pain, no nausea, not much gas and no stool in her ostomy bag,  Lab Results:   Basename 12/23/12 0435 12/22/12 0900  WBC 5.6 6.2  HGB 8.4* 9.3*  HCT 26.4* 29.6*  PLT 219 215    BMET  Basename 12/22/12 0900 12/21/12 0555  NA 142 136  K 3.9 4.3  CL 107 101  CO2 27 26  GLUCOSE 112* 137*  BUN 9 7  CREATININE 0.56 0.67  CALCIUM 8.1* 8.0*   PT/INR No results found for this basename: LABPROT:2,INR:2 in the last 72 hours   Lab 12/17/12 1651  AST 10  ALT 7  ALKPHOS 62  BILITOT 0.2*  PROT 7.8  ALBUMIN 3.3*     Lipase     Component Value Date/Time   LIPASE 12 12/17/2012 1651     Studies/Results: No results found.  Medications:    . ertapenem (INVANZ) IV  1 g Intravenous Q24H  . heparin  5,000 Units Subcutaneous Q8H  .  HYDROmorphone (DILAUDID) injection  1 mg Intravenous Once  . HYDROmorphone PCA 0.3 mg/mL   Intravenous Q4H  . nicotine  21 mg Transdermal Daily  . pantoprazole (PROTONIX) IV  40 mg Intravenous QHS   Prior to Admission medications   Medication Sig  Start Date End Date Taking? Authorizing Provider  Aspirin-Salicylamide-Caffeine (BC HEADACHE PO) Take 1 packet by mouth daily as needed. 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  Body mass index is 31.7 Tobacco use Plan:  Turn down her IV rate to 100, keep her NG clamped and start some sips, ambulate her in the halls, add iv tylenol for pain recheck labs Friday. Day 7 of Invanz today. Case manager to see.  She has no insurance and they plan to do wound vac at home.  Will change dressing tomorrow and get her back to M-W-F schedule next week. Possibly get her NG out later today or tomorrow.  She had them hook it back up last PM.       LOS: 6 days    Joanne Mora 12/23/2012

## 2012-12-24 MED ORDER — BIOTENE DRY MOUTH MT LIQD
15.0000 mL | Freq: Two times a day (BID) | OROMUCOSAL | Status: DC
  Administered 2012-12-24 – 2012-12-29 (×9): 15 mL via OROMUCOSAL

## 2012-12-24 MED ORDER — HYDROMORPHONE 0.3 MG/ML IV SOLN
INTRAVENOUS | Status: DC
  Administered 2012-12-24: 6.8 mg via INTRAVENOUS
  Administered 2012-12-24: 3.08 mg via INTRAVENOUS
  Administered 2012-12-24: 1.99 mg via INTRAVENOUS
  Administered 2012-12-24: 12:00:00 via INTRAVENOUS
  Administered 2012-12-24: 2.39 mg via INTRAVENOUS
  Administered 2012-12-25: 0.3 mg via INTRAVENOUS
  Administered 2012-12-25: via INTRAVENOUS
  Administered 2012-12-25: 2.59 mg via INTRAVENOUS
  Administered 2012-12-25: 2.99 mg via INTRAVENOUS
  Administered 2012-12-25: 3.59 mg via INTRAVENOUS
  Administered 2012-12-25: 2.97 mg via INTRAVENOUS
  Administered 2012-12-25: 22:00:00 via INTRAVENOUS
  Administered 2012-12-25: 2.19 mg via INTRAVENOUS
  Administered 2012-12-26: 09:00:00 via INTRAVENOUS
  Administered 2012-12-26: 1.19 mg via INTRAVENOUS
  Administered 2012-12-26: 2.99 mg via INTRAVENOUS
  Filled 2012-12-24 (×5): qty 25

## 2012-12-24 MED ORDER — HYDROMORPHONE 0.3 MG/ML IV SOLN: INTRAVENOUS | Status: DC

## 2012-12-24 MED ORDER — ACETAMINOPHEN 10 MG/ML IV SOLN
1000.0000 mg | Freq: Four times a day (QID) | INTRAVENOUS | Status: AC
  Administered 2012-12-24 – 2012-12-25 (×3): 1000 mg via INTRAVENOUS
  Filled 2012-12-24 (×4): qty 100

## 2012-12-24 NOTE — Progress Notes (Signed)
Patient is using large doses of narcotic in addition to her Toradol.  This may be slowing her bowel function.  Awaiting bowel function before removing NG tube Ostomy pink and viable Ambulate.  Wilmon Arms. Corliss Skains, MD, [facility] Surgery  12/24/2012 12:45 PM

## 2012-12-24 NOTE — Progress Notes (Signed)
Back on suction complaining of her stomach blowing up.  We did the dressing change and her abdominal wound below the vac looks fine.  Go back to ice chips for now. She has her dilaudid down to reduced dose. Labs in AM. IV tylenol also.

## 2012-12-24 NOTE — Progress Notes (Signed)
4 Days Post-Op  Subjective: No real change she did tolerate 720 ml of clears, she had the NG clamped all day without nausea, she did walk some. She wants to keep her PCA. She was also on a continuous dilaudid also, and taking 6.5 mg of dilaudid every 4 hours according to the nurse with her now.    Objective: Vital signs in last 24 hours: Temp:  [97.9 F (36.6 C)-99.4 F (37.4 C)] 98 F (36.7 C) (01/30 0610) Pulse Rate:  [65-86] 65  (01/30 0610) Resp:  [14-20] 16  (01/30 0756) BP: (105-126)/(50-65) 105/58 mmHg (01/30 0610) SpO2:  [96 %-100 %] 100 % (01/30 0756) FiO2 (%):  [42 %] 42 % (01/29 1343) Last BM Date:  (Colostomy) 720 Po recorded, nothing recorded from stool.  Afebrile, VSS, CBC is stable  Intake/Output from previous day: 01/29 0701 - 01/30 0700 In: 3780 [P.O.:720; I.V.:2760; IV Piggyback:300] Out: 400 [Emesis/NG output:375; Drains:25] Intake/Output this shift:    General appearance: alert, cooperative and no distress Resp: clear to auscultation bilaterally GI: soft, still tender, not distended.  some clear redish colored fluid in bag, maybe a few ml, otherwise empty, not really any gas in ostomy.  Wound vac in place and we will change that today.  Lab Results:   Basename 12/23/12 0435 12/22/12 0900  WBC 5.6 6.2  HGB 8.4* 9.3*  HCT 26.4* 29.6*  PLT 219 215    BMET  Basename 12/22/12 0900  NA 142  K 3.9  CL 107  CO2 27  GLUCOSE 112*  BUN 9  CREATININE 0.56  CALCIUM 8.1*   PT/INR No results found for this basename: LABPROT:2,INR:2 in the last 72 hours   Lab 12/17/12 1651  AST 10  ALT 7  ALKPHOS 62  BILITOT 0.2*  PROT 7.8  ALBUMIN 3.3*     Lipase     Component Value Date/Time   LIPASE 12 12/17/2012 1651     Studies/Results: No results found.  Medications:    . acetaminophen  1,000 mg Intravenous Q6H  . ertapenem (INVANZ) IV  1 g Intravenous Q24H  . heparin  5,000 Units Subcutaneous Q8H  .  HYDROmorphone (DILAUDID) injection  1 mg  Intravenous Once  . HYDROmorphone PCA 0.3 mg/mL   Intravenous Q4H  . nicotine  21 mg Transdermal Daily  . 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 Pain control issues. Post op ileus  Plan:  She wants to keep her NG even though it's clamped, she also wants to keep her PCA, I'm going to decrease it with the next cartridge change.  i did not know about the continuous IV dilaudid yesterday, What is above is what I was seeing.  I am stopping the continuous dilaudid, she is on the reduced dose, which gives the same total volume of dilaudid, but does it over a longer period.  Continue to mobilize, and see how she does.  From 0400 1/29-0756 1/30; I count 16.78 mg of dilaudid given.   Labs ordered for the AM.  LOS: 7 days    Konnor Vondrasek 12/24/2012

## 2012-12-24 NOTE — Progress Notes (Signed)
Joanne Mora. Corliss Skains, MD, [facility] Surgery  12/24/2012 4:59 PM

## 2012-12-24 NOTE — Consult Note (Signed)
Wound care follow-up:  Vac dressing changed; abd wound appearance the same as in previous progress note CCS PA will [place] at bedside to assess wound.  One piece black sponge applied to cont suction.  Pt tolerated with mod discomfort after meds given.  Plan for bedside nurse to change vac dressing on Sat.  Ostomy pouch intact with good seal, no stool or flatus in pouch.  Stoma red and viable.  Cammie Mcgee, RN, MSN, [employer]  [phone]

## 2012-12-24 NOTE — Progress Notes (Signed)
Physical Therapy Progress Note    12/24/12 1400  PT Visit Information  Last PT Received On 12/24/12  PT Time Calculation  PT Start Time 1425  PT Stop Time 1435  PT Time Calculation (min) 10 min  Subjective Data  Subjective I can't move I am in so much pain  Cognition  Overall Cognitive Status Appears within functional limits for tasks assessed/performed  Arousal/Alertness Awake/alert  Orientation Level Appears intact for tasks assessed;Oriented X4 / Intact  Behavior During Session [facility] for tasks performed  PT - Assessment/Plan  Comments on Treatment Session Attempted to see pt this am; pt politely refused stating she had just returned from walking and was very tired, in a lot of pain, and would like me to try back.  Came back to see patient this afternoon and patient again politely refusing ambulation secondary to increased pain from dressing change.  Spoke at length with patient on importance of mobility as much as tolerated. Recommended OOB to chair this evening in lieu of activity now.  Pt very concerned about reduction in pain medication at this time.    Charlotte Crumb, PT DPT  [phone]

## 2012-12-24 NOTE — Progress Notes (Signed)
Notified Dr. Magnus Ivan that pt stoma appears to be pale/white and that progress notes from [facility] and rounding physician stated that pt's stoma appeared pink/red. No new orders received. Will continue to monitor stoma. Also clarified Wound Vac orders with Dr. Magnus Ivan, per order Wound Vac to be 125 mmHg "intermittent", per Dr. Magnus Ivan leave at 125 mmHg "continuous."

## 2012-12-25 LAB — CBC
HCT: 27.4 % — ABNORMAL LOW (ref 36.0–46.0)
MCV: 88.7 fL (ref 78.0–100.0)
Platelets: 251 10*3/uL (ref 150–400)
RBC: 3.09 MIL/uL — ABNORMAL LOW (ref 3.87–5.11)
WBC: 6.5 10*3/uL (ref 4.0–10.5)

## 2012-12-25 LAB — COMPREHENSIVE METABOLIC PANEL
AST: 15 U/L (ref 0–37)
CO2: 29 mEq/L (ref 19–32)
Chloride: 100 mEq/L (ref 96–112)
Creatinine, Ser: 0.52 mg/dL (ref 0.50–1.10)
GFR calc non Af Amer: >90 mL/min (ref >90)
Total Bilirubin: 0.4 mg/dL (ref 0.3–1.2)

## 2012-12-25 MED ORDER — PROMETHAZINE HCL 25 MG/ML IJ SOLN: 6.2500 mg | Freq: Four times a day (QID) | INTRAMUSCULAR | Status: DC | PRN

## 2012-12-25 NOTE — Progress Notes (Signed)
Small amount of flatus Still with some distention Ostomy pink viable  Awaiting bowel function and resolution of post-op ileus before removing NG and starting diet Ambulate  Wilmon Arms. Corliss Skains, MD, [facility] Surgery  12/25/2012 2:01 PM

## 2012-12-25 NOTE — Progress Notes (Signed)
Clamped NGT per patient request.

## 2012-12-25 NOTE — Consult Note (Signed)
WOC ostomy follow-up  consult  Stoma type/location: Colostomy to left lower quad Stomal assessment/size: 1 inch, oval, flush with skin level, red and viable Peristomal assessment: Intact skin surrounding Output No stool or flatus at this time.  Small pink drainage. Ostomy pouching: 2pc.  Education provided: Applied 2 piece pouching system with barrier ring.  Pt is familiar with pouching routines and emptying. Denied further questions.  Placed on [place] discharge program.   Abd vac intact with good seal; bedside nurse to change on Sat.   Joanne Mcgee, RN, MSN, [employer]  [phone]

## 2012-12-25 NOTE — Progress Notes (Signed)
5 Days Post-Op  Subjective: No real change, complaining less about her pain meds.  Not moving anything thru the ostomy so far.  Objective: Vital signs in last 24 hours: Temp:  [97.8 F (36.6 C)-98.8 F (37.1 C)] 97.8 F (36.6 C) (01/31 0547) Pulse Rate:  [63-80] 68  (01/31 0547) Resp:  [11-18] 11  (01/31 0547) BP: (115-129)/(62-74) 115/64 mmHg (01/31 0547) SpO2:  [95 %-100 %] 99 % (01/31 0547) Last BM Date: 12/17/12 400 from NG yesterday, she has some stool recorded but nothing is in the ostomy bag, ice chips for diet after she placed herself back on suction. Labs OK,  Intake/Output from previous day: 01/30 0701 - 01/31 0700 In: 2890.9 [P.O.:300; I.V.:2490.9; IV Piggyback:100] Out: 415 [Emesis/NG output:400; Drains:10; Stool:5] Intake/Output this shift:    General appearance: alert, cooperative and no distress Resp: clear to auscultation bilaterally GI: hypoactive BS, nothing in ostomy bag again today, Minimal thru the NG.  Lab Results:   Basename 12/25/12 0445 12/23/12 0435  WBC 6.5 5.6  HGB 8.8* 8.4*  HCT 27.4* 26.4*  PLT 251 219    BMET  Basename 12/25/12 0445 12/22/12 0900  NA 136 142  K 3.8 3.9  CL 100 107  CO2 29 27  GLUCOSE 98 112*  BUN 6 9  CREATININE 0.52 0.56  CALCIUM 8.5 8.1*   PT/INR No results found for this basename: LABPROT:2,INR:2 in the last 72 hours   Lab 12/25/12 0445  AST 15  ALT 8  ALKPHOS 87  BILITOT 0.4  PROT 6.5  ALBUMIN 2.0*     Lipase     Component Value Date/Time   LIPASE 12 12/17/2012 1651     Studies/Results: No results found.  Medications:    . acetaminophen  1,000 mg Intravenous Q6H  . antiseptic oral rinse  15 mL Mouth Rinse BID  . ertapenem (INVANZ) IV  1 g Intravenous Q24H  . heparin  5,000 Units Subcutaneous Q8H  . HYDROmorphone PCA 0.3 mg/mL   Intravenous Q4H  . nicotine  21 mg Transdermal Daily  . 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  Pain control issues.  Post op ileus    Plan:  Vac change yesterday and we will change it tomorrow.  Plan to go to MWF  Vac changes on Monday.  Mobilize, leave her PCA at the low dose.  Clamp her NG and see how she does.  LOS: 8 days    Joanne Mora 12/25/2012

## 2012-12-25 NOTE — Progress Notes (Signed)
Occupational Therapy Treatment and Discharge Summary Patient Details Name: Joanne Mora MRN: [id number] DOB: May 29, 1966 Today's Date: 12/25/2012 Time: 4098-1191 OT Time Calculation (min): 30 min  OT Assessment / Plan / Recommendation Comments on Treatment Session Pt improving with adls and now is S to mod I.  S only required when lines are involved.  Do not feel pt is in need of acute OT services any longer.  Spoke with pt at lenght about the need to keep bathing herself, and getting OOB.  Will sign off.    Follow Up Recommendations  Supervision - Intermittent;No OT follow up    Barriers to Discharge       Equipment Recommendations  None recommended by OT    Recommendations for Other Services    Frequency Min 2X/week   Plan Discharge plan remains appropriate    Precautions / Restrictions Precautions Precautions: None Precaution Comments: Pt with multiple lines to tend to. Restrictions Weight Bearing Restrictions: No   Pertinent Vitals/Pain Pt with 7/10 pain.  Nursing aware.  Pt has PCA.    ADL  Eating/Feeding: Performed;Independent Where Assessed - Eating/Feeding: Chair Grooming: Performed;Wash/dry hands;Teeth care;Brushing hair;Modified independent Where Assessed - Grooming: Unsupported standing Upper Body Bathing: Performed;Set up Where Assessed - Upper Body Bathing: Unsupported sitting Lower Body Bathing: Performed;Supervision/safety Where Assessed - Lower Body Bathing: Unsupported sit to stand Upper Body Dressing: Performed;Set up Where Assessed - Upper Body Dressing: Unsupported sitting Lower Body Dressing: Performed;Modified independent Where Assessed - Lower Body Dressing: Unsupported sitting Toilet Transfer: Simulated;Modified independent Toilet Transfer Method: Sit to Barista: Comfort height toilet;Grab bars Toileting - Clothing Manipulation and Hygiene: Performed;Modified independent Where Assessed - Toileting Clothing Manipulation  and Hygiene: Standing Transfers/Ambulation Related to ADLs: Only assist needed was for lines. ADL Comments: Pt reached LEs today with no problem and did not require increased time.    OT Diagnosis:    OT Problem List:   OT Treatment Interventions:     OT Goals Acute Rehab OT Goals OT Goal Formulation: With patient Time For Goal Achievement: 12/30/12 Potential to Achieve Goals: Good ADL Goals Pt Will Perform Grooming: with modified independence;Standing at sink ADL Goal: Grooming - Progress: Met Pt Will Perform Lower Body Bathing: with modified independence;Sit to stand from chair;Sit to stand from bed;with adaptive equipment ADL Goal: Lower Body Bathing - Progress: Met Pt Will Perform Lower Body Dressing: with modified independence;Sit to stand from chair;Sit to stand from bed;with adaptive equipment ADL Goal: Lower Body Dressing - Progress: Met Miscellaneous OT Goals Miscellaneous OT Goal #1: Pt will perform all functional mobility at mod I level. OT Goal: Miscellaneous Goal #1 - Progress: Met  Visit Information  Last OT Received On: 12/25/12 Assistance Needed: +1    Subjective Data      Prior Functioning       Cognition  Overall Cognitive Status: Appears within functional limits for tasks assessed/performed Arousal/Alertness: Awake/alert Orientation Level: Appears intact for tasks assessed;Oriented X4 / Intact Behavior During Session: [facility] for tasks performed Cognition - Other Comments: intact.    Mobility  Shoulder Instructions Transfers Transfers: Sit to Stand;Stand to Sit Sit to Stand: 6: Modified independent (Device/Increase time) Stand to Sit: 6: Modified independent (Device/Increase time) Details for Transfer Assistance: Only limitation to mobility is lines.       Exercises      Balance Balance Balance Assessed: No   End of Session OT - End of Session Activity Tolerance: Patient limited by fatigue Patient left: in chair;with call bell/phone  within  reach Nurse Communication: Mobility status  GO     Hope Budds 12/25/2012, 10:58 AM [phone]

## 2012-12-25 NOTE — Progress Notes (Signed)
Physical Therapy Treatment Patient Details Name: BELIA FEBO MRN: [id number] DOB: [birth year] Today's Date: 12/25/2012 Time: 0102-7253 PT Time Calculation (min): 25 min  PT Assessment / Plan / Recommendation Comments on Treatment Session  Patient is ambulating at Mod I levels with no difficulty. Pt declined stair training at this time. If needed, will follow up on monday for stair negotiation. Anticipate pt will be safe for d.c home when medically ready.    Follow Up Recommendations  No PT follow up     Does the patient have the potential to tolerate intense rehabilitation     Barriers to Discharge        Equipment Recommendations  None recommended by PT    Recommendations for Other Services    Frequency Min 3X/week   Plan Discharge plan remains appropriate    Precautions / Restrictions Precautions Precautions: None Precaution Comments: Pt with multiple lines to tend to. Restrictions Weight Bearing Restrictions: No   Pertinent Vitals/Pain 7/10 reported    Mobility  Bed Mobility Bed Mobility: Not assessed Details for Bed Mobility Assistance: pt received in chair Transfers Transfers: Sit to Stand;Stand to Sit Sit to Stand: 6: Modified independent (Device/Increase time);From chair/3-in-1;From toilet Stand to Sit: 6: Modified independent (Device/Increase time);To chair/3-in-1;To toilet Details for Transfer Assistance: Pt able to get up and down from toilet as well as chair after ambulating a long distance Ambulation/Gait Ambulation/Gait Assistance: 6: Modified independent (Device/Increase time) (Mod I for increased time) Ambulation Distance (Feet): 600 Feet Assistive device: None Ambulation/Gait Assistance Details: Pt steady with gait, able to manage her own lines without difficulty, pt ambulated around the unit and down the hall then back to room, to and from restroom without difficulty. Gait Pattern: Within Functional Limits Gait velocity: decreased General Gait  Details: steady with gait Stairs: No      PT Goals Acute Rehab PT Goals PT Goal: Sit to Stand - Progress: Met PT Goal: Stand to Sit - Progress: Met PT Goal: Ambulate - Progress: Met PT Goal: Up/Down Stairs - Progress: Not met  Visit Information  Last PT Received On: 12/25/12 Assistance Needed: +1    Subjective Data  Subjective: I am starting to have a little gas Patient Stated Goal: to go home   Cognition  Overall Cognitive Status: Appears within functional limits for tasks assessed/performed Arousal/Alertness: Awake/alert Orientation Level: Appears intact for tasks assessed;Oriented X4 / Intact Behavior During Session: [facility] for tasks performed Cognition - Other Comments: intact.    Balance  Balance Balance Assessed: No Static Standing Balance Static Standing - Balance Support: No upper extremity supported Static Standing - Level of Assistance: 7: Independent Static Standing - Comment/# of Minutes: no difficulty  End of Session PT - End of Session Equipment Utilized During Treatment:  (NG tube (clamped), wound Vac) Activity Tolerance: Patient tolerated treatment well Patient left: in chair;with call bell/phone within reach;with nursing in room Nurse Communication: Mobility status   GP     Fabio Asa 12/25/2012, 3:23 PM Charlotte Crumb, PT DPT  [phone]

## 2012-12-25 NOTE — Progress Notes (Signed)
Pt complained of nausea, RN administered 4 mg Zofran IV, when entering room to check on pt IV beeping, pt had hooked NGT back to suction. NGT currently at [facility]. Pt still somewhat nauseated.

## 2012-12-26 MED ORDER — HYDROMORPHONE HCL PF 1 MG/ML IJ SOLN
0.5000 mg | INTRAMUSCULAR | Status: DC | PRN
  Administered 2012-12-26: 18:00:00 via INTRAVENOUS
  Administered 2012-12-26 – 2012-12-28 (×16): 0.5 mg via INTRAVENOUS
  Filled 2012-12-26 (×19): qty 1

## 2012-12-26 NOTE — Progress Notes (Signed)
General surgery attending note:  I have interviewed and examined this patient this morning. I agree with the assessment and treatment plan outlined by Mr. Marlyne Beards.  She is adamant that she would like clear liquids and would like NG tube out. She is willing to clamp the NG tube, however. I discussed narcotic reduction with her in hopes that that will assist in resolution of her ileus. I have told her that she might need to be started on TNA. That was disappointing to her.  Abdominal exam shows good bowel sounds but still somewhat distended and soft. Midline dressing in place. Area and colostomy bag in the stool.  Plan: Clamp NG tube Clear liquids ad lib. Discontinue Dilaudid PCA PRN IV dilaudid. , and I have emphasized to the patient and nursing staff that we would like to try to minimize narcotic use, unless her pain gets worse. If she has recurrent nausea or vomiting, then we will need to start TNA and get abdominal films. Check pre-albumin   Angelia Mould. Derrell Lolling, M.D., [facility] General and Minimally invasive Surgery Breast and Colorectal Surgery Office:   [phone] Pager:   [phone]

## 2012-12-26 NOTE — Progress Notes (Signed)
6 Days Post-Op  Subjective: Wants her NG out now.   Says she made 2 laps yesterday, no nausea.  She has some gas in her ostomy now, first time I have seen any.  Objective: Vital signs in last 24 hours: Temp:  [97.5 F (36.4 C)-99.3 F (37.4 C)] 98.1 F (36.7 C) (02/01 0510) Pulse Rate:  [58-72] 64  (02/01 0510) Resp:  [15-20] 17  (02/01 0510) BP: (118-140)/(58-67) 130/58 mmHg (02/01 0510) SpO2:  [93 %-99 %] 94 % (02/01 0510) Last BM Date: 12/17/12  PO 600 ml recorded.  350 from NG recorded, no stool recorded, Diet; none (ice chips)  Labs OKL yesterday, none today  Intake/Output from previous day: 01/31 0701 - 02/01 0700 In: 3033 [P.O.:600; I.V.:2383; IV Piggyback:50] Out: 375 [Emesis/NG output:350; Drains:25] Intake/Output this shift:    General appearance: alert, cooperative and no distress Resp: clear to auscultation bilaterally GI: soft, sore, not really distended, bowel sounds hypoactive, some gas in her ostomy bag this Am.  Lab Results:   [facility] 12/25/12 0445  WBC 6.5  HGB 8.8*  HCT 27.4*  PLT 251    BMET  Basename 12/25/12 0445  NA 136  K 3.8  CL 100  CO2 29  GLUCOSE 98  BUN 6  CREATININE 0.52  CALCIUM 8.5   PT/INR No results found for this basename: LABPROT:2,INR:2 in the last 72 hours   Lab 12/25/12 0445  AST 15  ALT 8  ALKPHOS 87  BILITOT 0.4  PROT 6.5  ALBUMIN 2.0*     Lipase     Component Value Date/Time   LIPASE 12 12/17/2012 1651     Studies/Results: No results found.  Medications:    . antiseptic oral rinse  15 mL Mouth Rinse BID  . ertapenem (INVANZ) IV  1 g Intravenous Q24H  . heparin  5,000 Units Subcutaneous Q8H  . HYDROmorphone PCA 0.3 mg/mL   Intravenous Q4H  . nicotine  21 mg Transdermal Daily  . pantoprazole (PROTONIX) IV  40 mg Intravenous QHS    Assessment/Plan Sigmoid diverticulitis with small perforation and scattered free intraperitoneal air - s/p Sigmoid colectomy and end colostomy with drainage of  intraabdominal abscess, [name], MD, 12/20/2012.  Pain control issues.  Post op ileus   Plan:  I have told her to walk 6-8 times today, all the way around the floor.  I will stop her NG.. We are reaching a point where we need to make a decision on nutrition.   LOS: 9 days    Joanne Mora 12/26/2012

## 2012-12-27 MED ORDER — FUROSEMIDE 20 MG PO TABS
20.0000 mg | ORAL_TABLET | Freq: Every day | ORAL | Status: DC
  Administered 2012-12-27 – 2012-12-29 (×3): 20 mg via ORAL
  Filled 2012-12-27 (×4): qty 1

## 2012-12-27 MED ORDER — PANTOPRAZOLE SODIUM 40 MG PO TBEC
40.0000 mg | DELAYED_RELEASE_TABLET | Freq: Every day | ORAL | Status: DC
  Administered 2012-12-27 – 2012-12-28 (×2): 40 mg via ORAL
  Filled 2012-12-27 (×2): qty 1

## 2012-12-27 MED ORDER — OXYCODONE-ACETAMINOPHEN 5-325 MG PO TABS
1.0000 | ORAL_TABLET | ORAL | Status: DC | PRN
  Administered 2012-12-27 – 2012-12-29 (×11): 2 via ORAL
  Filled 2012-12-27 (×12): qty 2

## 2012-12-27 MED ORDER — POTASSIUM CHLORIDE CRYS ER 20 MEQ PO TBCR
20.0000 meq | EXTENDED_RELEASE_TABLET | Freq: Every day | ORAL | Status: DC
  Administered 2012-12-27 – 2012-12-28 (×2): 20 meq via ORAL
  Filled 2012-12-27 (×3): qty 1

## 2012-12-27 NOTE — Progress Notes (Signed)
General surgery attending note:  Patient interviewed and examined. I agree with the evaluation and treatment plan outlined by Mr. Marlyne Beards, [place].  Her abdomen is still slightly distended but soft. Colostomy is working well. She tolerated NG clamping.  NG removed. Continue  liquid diet.   Angelia Mould. Derrell Lolling, M.D., [facility] General and Minimally invasive Surgery Breast and Colorectal Surgery Office:   [phone] Pager:   [phone]

## 2012-12-27 NOTE — Progress Notes (Signed)
Late Entry- Late last evening,pt pulled out NG.  She stated that she could not stand it anymore and that the doctor knew she was going to take it out sooner or later. Advised pt to let me know if she developed any nausea and to be cautious with her oral intake. Pt has had no nausea at this point and has been drinking ice water without difficulty.

## 2012-12-27 NOTE — Progress Notes (Signed)
7 Days Post-Op  Subjective: Pulled her NG out last PM. Wants more pain medicine, stool in her ostomy bag.  They changed her vac yesterday, but I did not see it. She is also asking to have IV fluids decreased.  Objective: Vital signs in last 24 hours: Temp:  [98 F (36.7 C)-99.3 F (37.4 C)] 98.5 F (36.9 C) (02/02 0502) Pulse Rate:  [65-72] 65  (02/02 0502) Resp:  [18] 18  (02/02 0502) BP: (126-136)/(50-69) 130/50 mmHg (02/02 0502) SpO2:  [94 %-99 %] 95 % (02/02 0502) Last BM Date: 01/17/13 1560 ml PO recorded, some output from the colostomy, she is 3.9 liters + yesterday and 19 liters positive for her admission. TM 99.3, VSS, prealbumin ordered for this AM no other labs. Intake/Output from previous day: 02/01 0701 - 02/02 0700 In: 3983.3 [P.O.:1560; I.V.:2423.3] Out: 1 [Stool:1] Intake/Output this shift:    General appearance: alert, cooperative and no distress Resp: clear to auscultation bilaterally GI: soft still complaining of pain, wants something now, + BS, stool in ostomy. Wound vac in place.  Lab Results:   [facility] 12/25/12 0445  WBC 6.5  HGB 8.8*  HCT 27.4*  PLT 251    BMET  Basename 12/25/12 0445  NA 136  K 3.8  CL 100  CO2 29  GLUCOSE 98  BUN 6  CREATININE 0.52  CALCIUM 8.5   PT/INR No results found for this basename: LABPROT:2,INR:2 in the last 72 hours   Lab 12/25/12 0445  AST 15  ALT 8  ALKPHOS 87  BILITOT 0.4  PROT 6.5  ALBUMIN 2.0*     Lipase     Component Value Date/Time   LIPASE 12 12/17/2012 1651     Studies/Results: No results found.  Medications:    . antiseptic oral rinse  15 mL Mouth Rinse BID  . ertapenem (INVANZ) IV  1 g Intravenous Q24H  . heparin  5,000 Units Subcutaneous Q8H  . nicotine  21 mg Transdermal Daily  . pantoprazole (PROTONIX) IV  40 mg Intravenous QHS    Assessment/Plan Sigmoid diverticulitis with small perforation and scattered free intraperitoneal air - s/p Sigmoid colectomy and end colostomy  with drainage of intraabdominal abscess, Joanne Lint, MD, 12/20/2012.  Pain control issues.  Post op ileus   Plan:  Clear liquid diet, decrease IV fluids, continue to mobilize, some lasix to help with fluid balance, check her weight, add po pain meds. Labs in AM.        LOS: 10 days    Joanne Mora 12/27/2012

## 2012-12-28 ENCOUNTER — Encounter (HOSPITAL_COMMUNITY): Payer: Self-pay | Admitting: General Surgery

## 2012-12-28 DIAGNOSIS — Z720 Tobacco use: Secondary | ICD-10-CM | POA: Diagnosis present

## 2012-12-28 DIAGNOSIS — K567 Ileus, unspecified: Secondary | ICD-10-CM

## 2012-12-28 DIAGNOSIS — Z6831 Body mass index (BMI) 31.0-31.9, adult: Secondary | ICD-10-CM

## 2012-12-28 DIAGNOSIS — K5720 Diverticulitis of large intestine with perforation and abscess without bleeding: Secondary | ICD-10-CM

## 2012-12-28 DIAGNOSIS — K9189 Other postprocedural complications and disorders of digestive system: Secondary | ICD-10-CM | POA: Diagnosis not present

## 2012-12-28 HISTORY — DX: Body mass index (BMI) 31.0-31.9, adult: Z68.31

## 2012-12-28 HISTORY — DX: Ileus, unspecified: K56.7

## 2012-12-28 HISTORY — DX: Diverticulitis of large intestine with perforation and abscess without bleeding: K57.20

## 2012-12-28 HISTORY — DX: Tobacco use: Z72.0

## 2012-12-28 LAB — CBC
MCH: 28 pg (ref 26.0–34.0)
MCHC: 31.8 g/dL (ref 30.0–36.0)
Platelets: 293 10*3/uL (ref 150–400)
RBC: 3.43 MIL/uL — ABNORMAL LOW (ref 3.87–5.11)
RDW: 15.9 % — ABNORMAL HIGH (ref 11.5–15.5)

## 2012-12-28 LAB — BASIC METABOLIC PANEL
BUN: 5 mg/dL — ABNORMAL LOW (ref 6–23)
Calcium: 8.9 mg/dL (ref 8.4–10.5)
Creatinine, Ser: 0.59 mg/dL (ref 0.50–1.10)
GFR calc non Af Amer: >90 mL/min (ref >90)
Glucose, Bld: 108 mg/dL — ABNORMAL HIGH (ref 70–99)
Sodium: 136 mEq/L (ref 135–145)

## 2012-12-28 MED ORDER — AMOXICILLIN-POT CLAVULANATE 875-125 MG PO TABS
1.0000 | ORAL_TABLET | Freq: Two times a day (BID) | ORAL | Status: DC
  Administered 2012-12-28 – 2012-12-29 (×3): 1 via ORAL
  Filled 2012-12-28 (×4): qty 1

## 2012-12-28 MED ORDER — TAB-A-VITE/IRON PO TABS
1.0000 | ORAL_TABLET | Freq: Every day | ORAL | Status: DC
  Filled 2012-12-28 (×2): qty 1

## 2012-12-28 MED ORDER — ALPRAZOLAM 0.25 MG PO TABS
0.2500 mg | ORAL_TABLET | Freq: Three times a day (TID) | ORAL | Status: DC | PRN
  Administered 2012-12-28: 0.25 mg via ORAL
  Filled 2012-12-28: qty 1

## 2012-12-28 MED ORDER — FERROUS SULFATE 325 (65 FE) MG PO TABS
325.0000 mg | ORAL_TABLET | Freq: Every day | ORAL | Status: DC
  Filled 2012-12-28 (×2): qty 1

## 2012-12-28 NOTE — Discharge Summary (Signed)
Physician Discharge Summary  Patient ID: Joanne Mora MRN: [id number] DOB/AGE: [birth year] 45 y.o.  Admit date: 12/17/2012 Discharge date: 12/29/2012  Admission Diagnoses: Sigmoid Diverticulitis withperforation   Discharge Diagnoses:  Sigmoid colectomy and end colostomy with drainage of intraabdominal abscess, obstruction.  Post op pain intolerance Ileus Body mass index is 31.7  Tobacco use  Principal Problem:  *Diverticulitis of large intestine with perforation Active Problems:  Ileus following gastrointestinal surgery  Tobacco use  BMI 31.0-31.9,adult   PROCEDURES:  Sigmoid colectomy and end colostomy with drainage of intraabdominal abscess, 12/20/12 [facility]      Hospital Course:  [name] She has been having regular bowel movements. She denies blood in her stool. The pain moved up from her pelvis more centrally in her abdomen today and she came to the emergency department for evaluation. Laboratory studies are normal but CT scan of the abdomen and pelvis demonstrates a significant sigmoid diverticulitis with small perforation and small dots of free air. Her pain is improved somewhat after receiving pain medication. She was seen and admitted for treatment on 12/17/12.   She was treated medically for 3 days, she did not improve, in fact became worse and was taken to the OR on the 3rd day by Dr. Donell Beers. She did well post op, but required high dose narcotic use.  She was slow to come around with a post op ileus. We eventually just restricted her narcotic use and she opened up.  She had a Vac dressing over her abdominal wound, and this looked good with each dressing change.  By 12/28/12 she was doing well.  He diet is advanced to a low fiber diet, and if she does well she will go home tomorrow.  They have no insurance and will go home with wet to dry dressing changes, low fiber diet, Augmentin, and help with her  ostomy.  Follow up in 2 weeks with Dr. Donell Beers    Disposition: Final discharge disposition not confirmed     Medication List     As of 12/29/2012  9:38 AM    STOP taking these medications         BC HEADACHE PO      TAKE these medications         acetaminophen 325 MG tablet   Commonly known as: TYLENOL   Take 2 tablets (650 mg total) by mouth every 6 (six) hours as needed for fever.      amoxicillin-clavulanate 875-125 MG per tablet   Commonly known as: AUGMENTIN   Take 1 tablet by mouth every 12 (twelve) hours.      ferrous sulfate 325 (65 FE) MG tablet   Take 1 tablet (325 mg total) by mouth daily with supper.      multivitamins with iron Tabs   Take 1 tablet by mouth daily with lunch.      oxyCODONE-acetaminophen 10-325 MG per tablet   Commonly known as: PERCOCET   Take 1 tablet by mouth every 4 (four) hours as needed for pain.         Follow-up Information    Follow up with [facility], MD. Schedule an appointment as soon as possible for a visit in 2 weeks.   Contact information:   [address] [place] [address] [phone]          Signed: Sherrie George 12/29/2012, 9:38 AM

## 2012-12-28 NOTE — Progress Notes (Signed)
Physical Therapy Treatment Patient Details Name: Joanne Mora MRN: [id number] DOB: [birth year] Today's Date: 12/28/2012 Time: 1042-1100 PT Time Calculation (min): 18 min  PT Assessment / Plan / Recommendation Comments on Treatment Session  Patient demonstrates good functional mobility. Ambulating well independently, able to negotiate stairs without difficulty, and receptive to education on car transfers. Feel patient will be safe for d/c when medically cleared. No further Acute PT needs, will sign off.    Follow Up Recommendations  No PT follow up     Does the patient have the potential to tolerate intense rehabilitation     Barriers to Discharge        Equipment Recommendations  None recommended by PT    Recommendations for Other Services    Frequency Min 3X/week   Plan Discharge plan remains appropriate    Precautions / Restrictions Precautions Precautions: None Precaution Comments: Pt with multiple lines to tend to. Restrictions Weight Bearing Restrictions: No   Pertinent Vitals/Pain 5/10    Mobility  Bed Mobility Bed Mobility: Supine to Sit;Sitting - Scoot to Edge of Bed Supine to Sit: 7: Independent Sitting - Scoot to Edge of Bed: 7: Independent Transfers Transfers: Sit to Stand;Stand to Sit Sit to Stand: 6: Modified independent (Device/Increase time);From chair/3-in-1;From toilet Stand to Sit: 6: Modified independent (Device/Increase time);To chair/3-in-1;To toilet Details for Transfer Assistance: Steady transfers  Ambulation/Gait Ambulation/Gait Assistance: 6: Modified independent (Device/Increase time) (Mod I for increased time) Ambulation Distance (Feet): 300 Feet Assistive device: None Gait Pattern: Within Functional Limits Gait velocity: decreased General Gait Details: steady with gait Stairs: Yes Stairs Assistance: 4: Min guard Stair Management Technique: One rail Left;Forwards Number of Stairs: 6       PT Goals Acute Rehab PT Goals PT Goal:  Supine/Side to Sit - Progress: Met PT Goal: Sit at Edge Of Bed - Progress: Met PT Goal: Sit to Supine/Side - Progress: Met PT Goal: Sit to Stand - Progress: Met PT Goal: Stand to Sit - Progress: Met PT Goal: Ambulate - Progress: Met PT Goal: Up/Down Stairs - Progress: Partly met  Visit Information  Last PT Received On: 12/28/12 Assistance Needed: +1    Subjective Data  Subjective: I am feeling so much better Patient Stated Goal: to go home   Cognition  Cognition Overall Cognitive Status: Appears within functional limits for tasks assessed/performed Arousal/Alertness: Awake/alert Orientation Level: Appears intact for tasks assessed;Oriented X4 / Intact Behavior During Session: [facility] for tasks performed Cognition - Other Comments: intact.    Balance  Balance Balance Assessed: No Static Standing Balance Static Standing - Balance Support: No upper extremity supported Static Standing - Level of Assistance: 7: Independent  End of Session PT - End of Session Equipment Utilized During Treatment: Gait belt Activity Tolerance: Patient tolerated treatment well Patient left: in chair;with call bell/phone within reach Nurse Communication: Mobility status   GP     Fabio Asa 12/28/2012, 11:10 AM Charlotte Crumb, PT DPT  [phone]

## 2012-12-28 NOTE — Progress Notes (Signed)
8 Days Post-Op  Subjective: Hungry, wants to up her diet.  She is having good flow thru her colostomy now.  Objective: Vital signs in last 24 hours: Temp:  [97.7 F (36.5 C)-98.2 F (36.8 C)] 97.7 F (36.5 C) (02/03 0626) Pulse Rate:  [61-68] 61  (02/03 0626) Resp:  [18] 18  (02/03 0626) BP: (113-132)/(62-64) 118/63 mmHg (02/03 0626) SpO2:  [96 %-98 %] 97 % (02/03 0626) Last BM Date: 12/27/12  Afebrile, VSS, labs OK, labs OK, full liquid diet  Intake/Output from previous day: 02/02 0701 - 02/03 0700 In: 1097.5 [P.O.:600; I.V.:497.5] Out: 3 [Stool:3] Intake/Output this shift:    General appearance: alert, cooperative and no distress Resp: clear to auscultation bilaterally GI: still tender, but colostomy working well.  Wound vac in place.  Lab Results:   [facility] 12/28/12 0545  WBC 10.1  HGB 9.6*  HCT 30.2*  PLT 293    BMET  Basename 12/28/12 0545  NA 136  K 3.8  CL 100  CO2 26  GLUCOSE 108*  BUN 5*  CREATININE 0.59  CALCIUM 8.9   PT/INR No results found for this basename: LABPROT:2,INR:2 in the last 72 hours   Lab 12/25/12 0445  AST 15  ALT 8  ALKPHOS 87  BILITOT 0.4  PROT 6.5  ALBUMIN 2.0*     Lipase     Component Value Date/Time   LIPASE 12 12/17/2012 1651     Studies/Results: No results found.  Medications:    . antiseptic oral rinse  15 mL Mouth Rinse BID  . ertapenem (INVANZ) IV  1 g Intravenous Q24H  . furosemide  20 mg Oral Daily  . heparin  5,000 Units Subcutaneous Q8H  . nicotine  21 mg Transdermal Daily  . pantoprazole  40 mg Oral Q1200  . potassium chloride  20 mEq Oral Q supper    Assessment/Plan Sigmoid diverticulitis with small perforation and scattered free intraperitoneal air - s/p Sigmoid colectomy and end colostomy with drainage of intraabdominal abscess, Joanne Lint, MD, 12/20/2012.  Pain control issues.  Post op ileus  She has had 12 days of Invanz. And I will stop that now.  Plan:  Stop  IV antibiotics,  start her on augmentin although I'm not sure she needs it.  Place her on a low fiber diet, add Fe for her anemia.  She has no insurance so will discuss going to wet to dry dressings in AM.  I will discuss with case manager and see if there is anything they can offer.  Get her off all IV pain medicines and aim for dc home tomorrow.  she has decided to just go with wet to dry, and is asking about obtaining colostomy supplies.     LOS: 11 days    Joanne Mora 12/28/2012

## 2012-12-28 NOTE — Progress Notes (Signed)
General surgery attending note:  I have interviewed and examined this patient this morning. I agree with the assessment treatment plan outlined by Mr. Marlyne Beards, [place].  Her abdomen is getting softer. The VAC is in place but will have to switch to wet to dry dressings because she has no insurance for a VAC. She has 2 other adults in the home who can help her with wound care and ostomy care. 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  Hopefully she will be discharged tomorrow. CM to evaluate.  She asked to see if she can have a second prescription for breakthrough pain. I suggested that she would just take another Percocet.   Angelia Mould. Derrell Lolling, M.D., [facility] General and Minimally invasive Surgery Breast and Colorectal Surgery Office:   [phone] Pager:   [phone]

## 2012-12-28 NOTE — Consult Note (Addendum)
Wound care follow-up: Discussed plan of care with [facility].  Plans to D/C vac tomorrow and convert to wet to dry dressings before discharge home.  Will demonstrate ostomy pouching tomorrow at 0900 as requested by pt. Pouch intact with good seal.   Cammie Mcgee, RN, MSN, [employer]  [phone]

## 2012-12-29 MED ORDER — OXYCODONE-ACETAMINOPHEN 10-325 MG PO TABS: 1.0000 | ORAL_TABLET | ORAL | Status: DC | PRN

## 2012-12-29 MED ORDER — FERROUS SULFATE 325 (65 FE) MG PO TABS: 325.0000 mg | ORAL_TABLET | Freq: Every day | ORAL | Status: DC

## 2012-12-29 MED ORDER — AMOXICILLIN-POT CLAVULANATE 875-125 MG PO TABS: 1.0000 | ORAL_TABLET | Freq: Two times a day (BID) | ORAL | Status: DC

## 2012-12-29 MED ORDER — TAB-A-VITE/IRON PO TABS: 1.0000 | ORAL_TABLET | Freq: Every day | ORAL | Status: DC

## 2012-12-29 MED ORDER — ACETAMINOPHEN 325 MG PO TABS: 650.0000 mg | ORAL_TABLET | Freq: Four times a day (QID) | ORAL | Status: DC | PRN

## 2012-12-29 NOTE — Progress Notes (Signed)
I have interviewed and examined this patient this morning. I agree with the assessment and treatment plan as outlined.

## 2012-12-29 NOTE — Consult Note (Addendum)
Ostomy follow-up:  Will Jennings, [facility]CS PA at bedside to remove vac and assess abd wound.  Beefy red and decreasing in size and depth.  Vac ordered D/C and wound packed with moist gauze.  Demonstrated pouch change.  Pt assisted and states she is familiar with application and emptying, denies further questions.  Stoma red and viable, 11/4 inch oval, flush with skin level.  Mod semi formed stool in pouch. Applied barrier ring and 2 piece pouching system.  Discussed ordering supplies and pouching routines. Additional supplies at bedside. Has been placed on [facility] discharge program. Will not plan to follow further unless re-consulted.  [address] RN, MSN, [employer]  [phone]

## 2012-12-29 NOTE — Discharge Summary (Signed)
I have interviewed and examined this patient today. I agree with the assessment and discharge plan and followup as outlined.  Angelia Mould. Derrell Lolling, M.D., [facility] General and Minimally invasive Surgery Breast and Colorectal Surgery Office:   [phone] Pager:   [phone]

## 2012-12-29 NOTE — Progress Notes (Signed)
9 Days Post-Op  Subjective: Ready to go home.  Wants 10/325 percocet.  Tolerating diet and colostomy working well  Objective: Vital signs in last 24 hours: Temp:  [97.4 F (36.3 C)-98.7 F (37.1 C)] 97.9 F (36.6 C) (02/04 0534) Pulse Rate:  [64-68] 64  (02/04 0534) Resp:  [18-20] 18  (02/04 0534) BP: (107-132)/(48-64) 107/52 mmHg (02/04 0534) SpO2:  [97 %-98 %] 97 % (02/04 0534) Last BM Date: 12/28/12 No stool recorded yesterday, but bag was full when I saw her. Afebrile, VSS, labs OK,   Intake/Output from previous day: 02/03 0701 - 02/04 0700 In: 2305 [P.O.:840; I.V.:1465] Out: 102 [Urine:2; Drains:100] Intake/Output this shift: Total I/O In: 120 [P.O.:120] Out: -   General appearance: alert, cooperative and no distress Resp: clear to auscultation bilaterally and down a little left base. GI: Her wound looks fine, Dawn is helping her with her ostomy.    Lab Results:   [facility] 12/28/12 0545  WBC 10.1  HGB 9.6*  HCT 30.2*  PLT 293    BMET  Basename 12/28/12 0545  NA 136  K 3.8  CL 100  CO2 26  GLUCOSE 108*  BUN 5*  CREATININE 0.59  CALCIUM 8.9   PT/INR No results found for this basename: LABPROT:2,INR:2 in the last 72 hours   Lab 12/25/12 0445  AST 15  ALT 8  ALKPHOS 87  BILITOT 0.4  PROT 6.5  ALBUMIN 2.0*     Lipase     Component Value Date/Time   LIPASE 12 12/17/2012 1651     Studies/Results: No results found.  Medications:    . amoxicillin-clavulanate  1 tablet Oral Q12H  . antiseptic oral rinse  15 mL Mouth Rinse BID  . ferrous sulfate  325 mg Oral Q supper  . furosemide  20 mg Oral Daily  . heparin  5,000 Units Subcutaneous Q8H  . multivitamins with iron  1 tablet Oral Q lunch  . nicotine  21 mg Transdermal Daily  . pantoprazole  40 mg Oral Q1200  . potassium chloride  20 mEq Oral Q supper    Assessment/Plan Sigmoid diverticulitis with small perforation and scattered free intraperitoneal air - s/p Sigmoid colectomy and end  colostomy with drainage of intraabdominal abscess, [name], MD, 12/20/2012.  Pain control issues.  Post op ileus  She has had 12 days of Invanz. And I will stop that now  Plan:  She is ready to go home today,  Wet to dry dressing. 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  We will keep her on antibiotics for another week. Follow up with Dr. Donell Beers in 2 weeks.      LOS: 12 days    Joanne Mora 12/29/2012

## 2012-12-30 ENCOUNTER — Telehealth (INDEPENDENT_AMBULATORY_CARE_PROVIDER_SITE_OTHER): Payer: Self-pay | Admitting: General Surgery

## 2012-12-30 NOTE — Telephone Encounter (Signed)
Dois Davenport from [facility] called and stated that the patient refused nursing care services. 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 Dois Davenport said her office most likely would send documentation to our office for our record noting the patient refused care.

## 2013-01-03 ENCOUNTER — Encounter (HOSPITAL_COMMUNITY): Payer: Self-pay | Admitting: Emergency Medicine

## 2013-01-03 ENCOUNTER — Emergency Department (HOSPITAL_COMMUNITY): Payer: Self-pay

## 2013-01-03 ENCOUNTER — Inpatient Hospital Stay (HOSPITAL_COMMUNITY)
Admission: EM | Admit: 2013-01-03 | Discharge: 2013-01-06 | DRG: 392 | Disposition: A | Discharge status: Home / Self Care | Payer: MEDICAID | Attending: Surgery | Admitting: Surgery

## 2013-01-03 DIAGNOSIS — R188 Other ascites: Secondary | ICD-10-CM | POA: Diagnosis present

## 2013-01-03 DIAGNOSIS — R1011 Right upper quadrant pain: Principal | ICD-10-CM | POA: Diagnosis present

## 2013-01-03 DIAGNOSIS — J90 Pleural effusion, not elsewhere classified: Secondary | ICD-10-CM | POA: Diagnosis present

## 2013-01-03 DIAGNOSIS — J9819 Other pulmonary collapse: Secondary | ICD-10-CM | POA: Diagnosis present

## 2013-01-03 DIAGNOSIS — B3731 Acute candidiasis of vulva and vagina: Secondary | ICD-10-CM | POA: Diagnosis present

## 2013-01-03 DIAGNOSIS — B373 Candidiasis of vulva and vagina: Secondary | ICD-10-CM | POA: Diagnosis present

## 2013-01-03 LAB — CBC WITH DIFFERENTIAL/PLATELET
Basophils Absolute: 0 10*3/uL (ref 0.0–0.1)
Eosinophils Relative: 2 % (ref 0–5)
HCT: 32.4 % — ABNORMAL LOW (ref 36.0–46.0)
Lymphocytes Relative: 13 % (ref 12–46)
Lymphs Abs: 1.1 10*3/uL (ref 0.7–4.0)
MCV: 88.8 fL (ref 78.0–100.0)
Monocytes Absolute: 0.8 10*3/uL (ref 0.1–1.0)
RDW: 15.3 % (ref 11.5–15.5)
WBC: 8.7 10*3/uL (ref 4.0–10.5)

## 2013-01-03 LAB — COMPREHENSIVE METABOLIC PANEL
CO2: 26 mEq/L (ref 19–32)
Calcium: 9.4 mg/dL (ref 8.4–10.5)
Creatinine, Ser: 0.55 mg/dL (ref 0.50–1.10)
GFR calc Af Amer: >90 mL/min (ref >90)
GFR calc non Af Amer: >90 mL/min (ref >90)
Glucose, Bld: 117 mg/dL — ABNORMAL HIGH (ref 70–99)

## 2013-01-03 LAB — LIPASE, BLOOD: Lipase: 27 U/L (ref 11–59)

## 2013-01-03 MED ORDER — IOHEXOL 300 MG/ML  SOLN
50.0000 mL | Freq: Once | INTRAMUSCULAR | Status: AC | PRN
  Administered 2013-01-03: 50 mL via ORAL

## 2013-01-03 MED ORDER — HYDROMORPHONE HCL PF 1 MG/ML IJ SOLN
1.0000 mg | Freq: Once | INTRAMUSCULAR | Status: AC
  Administered 2013-01-03: 1 mg via INTRAVENOUS
  Filled 2013-01-03: qty 1

## 2013-01-03 MED ORDER — PIPERACILLIN-TAZOBACTAM 3.375 G IVPB 30 MIN
3.3750 g | Freq: Once | INTRAVENOUS | Status: AC
  Administered 2013-01-03: 3.375 g via INTRAVENOUS
  Filled 2013-01-03: qty 50

## 2013-01-03 MED ORDER — HYDROMORPHONE HCL PF 2 MG/ML IJ SOLN
2.0000 mg | INTRAMUSCULAR | Status: DC | PRN
  Administered 2013-01-03 – 2013-01-04 (×2): 2 mg via INTRAVENOUS
  Filled 2013-01-03: qty 1
  Filled 2013-01-03 (×2): qty 2

## 2013-01-03 MED ORDER — PIPERACILLIN-TAZOBACTAM 3.375 G IVPB
3.3750 g | Freq: Three times a day (TID) | INTRAVENOUS | Status: DC
  Administered 2013-01-04 – 2013-01-06 (×7): 3.375 g via INTRAVENOUS
  Filled 2013-01-03 (×10): qty 50

## 2013-01-03 MED ORDER — KCL IN DEXTROSE-NACL 20-5-0.9 MEQ/L-%-% IV SOLN
INTRAVENOUS | Status: DC
Start: 2013-01-04 — End: 2013-01-06
  Administered 2013-01-04 – 2013-01-06 (×4): via INTRAVENOUS
  Filled 2013-01-03 (×8): qty 1000

## 2013-01-03 MED ORDER — IOHEXOL 300 MG/ML  SOLN
100.0000 mL | Freq: Once | INTRAMUSCULAR | Status: AC | PRN
  Administered 2013-01-03: 100 mL via INTRAVENOUS

## 2013-01-03 MED ORDER — PANTOPRAZOLE SODIUM 40 MG IV SOLR
40.0000 mg | Freq: Every day | INTRAVENOUS | Status: DC
  Administered 2013-01-04 (×2): 40 mg via INTRAVENOUS
  Filled 2013-01-03 (×4): qty 40

## 2013-01-03 MED ORDER — HYDROMORPHONE HCL PF 1 MG/ML IJ SOLN
0.5000 mg | Freq: Once | INTRAMUSCULAR | Status: AC
  Administered 2013-01-03: 0.5 mg via INTRAVENOUS

## 2013-01-03 MED ORDER — HYDROMORPHONE HCL PF 1 MG/ML IJ SOLN
1.0000 mg | INTRAMUSCULAR | Status: DC | PRN
  Filled 2013-01-03: qty 1

## 2013-01-03 MED ORDER — ONDANSETRON HCL 4 MG/2ML IJ SOLN: 4.0000 mg | Freq: Four times a day (QID) | INTRAMUSCULAR | Status: DC | PRN

## 2013-01-03 NOTE — ED Provider Notes (Signed)
Date: 01/03/2013  Rate: 81  Rhythm: normal sinus rhythm  QRS Axis: normal  Intervals: normal  ST/T Wave abnormalities: normal  Conduction Disutrbances:none  Narrative Interpretation:   Old EKG Reviewed: none available at time of interpretation  4:37 PM  Pt signed out to me pending CT imaging.  She is point tender to RUQ and lower right costal margin.  CT abd/pelvis pending at this time  8:09 PM CT findings noted. I have called gen surgery dr Carolynne Edouard.  He recommends zosyn and he will admit patient.  He does not want holding orders Pt currently stable. Pt has been updated  Joya Gaskins, MD 01/03/13 2010

## 2013-01-03 NOTE — ED Notes (Signed)
MD toth at bedside to discuss plan of care, report given to [facility]

## 2013-01-03 NOTE — ED Notes (Signed)
Pt returned from CT °

## 2013-01-03 NOTE — ED Notes (Signed)
Patient transported to Ultrasound 

## 2013-01-03 NOTE — ED Provider Notes (Signed)
History     CSN: [id number]  Arrival date & time 01/03/13  5784   First MD Initiated Contact with Patient 01/03/13 0945      Chief Complaint  Patient presents with  . Abdominal Pain    (Consider location/radiation/quality/duration/timing/severity/associated sxs/prior treatment) HPI Patient presents to the ER with RUQ pain that began last night. The patient states that she just left the hospital following a perfed colon from Diverticulitis. The patient states that her previous episode caused pain like this. The patient denies vomiting but has nausea. The patient states that her surgical sites are normal but that RUQ. The patient denies chest pain, sob, fever, weakness, dysuria, back pain, vomiting, wound breakdown, or syncope. The patient states that this patient started last night. Past Medical History  Diagnosis Date  . Diverticulitis of large intestine with perforation 12/28/2012    Sigmoid colon.  . Ileus following gastrointestinal surgery 12/28/2012  . Tobacco use 12/28/2012  . BMI 31.0-31.9,adult 12/28/2012    Past Surgical History  Procedure Laterality Date  . Laparotomy  12/20/2012    Procedure: EXPLORATORY LAPAROTOMY;  Surgeon: [name], MD;  Location: [facility] OR;  Service: General;  Laterality: N/A;  . Colostomy revision  12/20/2012    Procedure: COLON RESECTION SIGMOID;  Surgeon: [name], MD;  Location: [facility] OR;  Service: General;  Laterality: N/A;  . Colostomy  12/20/2012    Procedure: COLOSTOMY;  Surgeon: [name], MD;  Location: [facility] OR;  Service: General;  Laterality: N/A;    History reviewed. No pertinent family history.  History  Substance Use Topics  . Smoking status: Current Every Day Smoker -- 1.00 packs/day    Types: Cigarettes  . Smokeless tobacco: Not on file  . Alcohol Use: Yes     Comment: socially    OB History   Grav Para Term Preterm Abortions TAB SAB Ect Mult Living                  Review of Systems All other systems negative except as  documented in the HPI. All pertinent positives and negatives as reviewed in the HPI.  Allergies  Review of patient's allergies indicates no known allergies.  Home Medications   Current Outpatient Rx  Name  Route  Sig  Dispense  Refill  . amoxicillin-clavulanate (AUGMENTIN) 875-125 MG per tablet   Oral   Take 1 tablet by mouth 2 (two) times daily.         Marland Kitchen oxyCODONE-acetaminophen (PERCOCET) 10-325 MG per tablet   Oral   Take 1 tablet by mouth every 4 (four) hours as needed for pain. For pain           BP 119/56  Pulse 79  Temp(Src) 99.2 F (37.3 C) (Oral)  Resp 16  SpO2 97%  LMP 12/03/2012  Physical Exam  Nursing note and vitals reviewed. Constitutional: She is oriented to person, place, and time. She appears well-developed and well-nourished. No distress.  HENT:  Head: Normocephalic and atraumatic.  Mouth/Throat: Oropharynx is clear and moist.  Eyes: Pupils are equal, round, and reactive to light.  Neck: Normal range of motion. Neck supple.  Cardiovascular: Normal rate, regular rhythm and normal heart sounds.  Exam reveals no gallop and no friction rub.   No murmur heard. Pulmonary/Chest: Effort normal and breath sounds normal. No respiratory distress.  Abdominal: Soft. Bowel sounds are normal. She exhibits no distension, no ascites, no pulsatile midline mass and no mass. There is tenderness. There is guarding. There is no  rigidity and no rebound.    Neurological: She is alert and oriented to person, place, and time.  Skin: Skin is warm and dry. No rash noted. No erythema.    ED Course  Procedures (including critical care time)  Labs Reviewed  CBC WITH DIFFERENTIAL - Abnormal; Notable for the following:    RBC 3.65 (*)    Hemoglobin 10.2 (*)    HCT 32.4 (*)    Platelets 496 (*)    All other components within normal limits  COMPREHENSIVE METABOLIC PANEL - Abnormal; Notable for the following:    Glucose, Bld 117 (*)    BUN 4 (*)    Total Protein 8.6 (*)      Albumin 3.1 (*)    Total Bilirubin 0.2 (*)    All other components within normal limits  LIPASE, BLOOD   US Abdomen Complete  01/03/2013  *RADIOLOGY REPORT*  Clinical Data:  Right upper quadrant abdominal pain  COMPLETE ABDOMINAL ULTRASOUND  Comparison:  CT abdomen pelvis of 12/17/2012  Findings:  Gallbladder:  The gallbladder is visualized and only a small amount of gallbladder sludge is present.  No gallstones are noted.  There is no pain over the gallbladder with compression.  Common bile duct:  The common bile duct is normal measuring 4.0 mm in diameter.  Liver:  The liver has a normal echogenic pattern.  No ductal dilatation is seen.  IVC:  Appears normal.  Pancreas:  No focal abnormality seen.  Spleen:  The spleen is normal measuring 8.0 cm sagittally.  Right Kidney:  No hydronephrosis is seen.  The right kidney measures 13.9 cm sagittally.  Left Kidney:  No hydronephrosis is noted.  The left kidney measures 14.2 cm.  Abdominal aorta:  The abdominal aorta is normal in caliber.  Bilateral pleural effusions are present.  IMPRESSION:  1.  Negative abdominal ultrasound.  No gallstones.  No ductal dilatation. 2.  Bilateral pleural effusions.   Original Report Authenticated By: Dwyane Dee, M.D.    Dg Abd Acute W/chest  01/03/2013  *RADIOLOGY REPORT*  Clinical Data: Right upper quadrant pain/spasms  ACUTE ABDOMEN SERIES (ABDOMEN 2 VIEW & CHEST 1 VIEW)  Comparison: Prior CT abdomen/pelvis 12/17/2012  Findings: Patchy airspace opacities in the right greater than left lower lobes.  The remainder of the lungs are clear.  Cardiac and mediastinal contours within normal limits.  Nonspecific bowel gas pattern with a relative paucity of bowel gas. No evidence of free air or significant air-fluid levels on the upright view.  Elongated right lobe of the liver consistent with right else lobe.  No other organomegaly.  No definite calcifications.  Ostomy bag projects over the left lower quadrant. No acute osseous  abnormality.  IMPRESSION:  1.  Patchy bibasilar air space disease may reflect atelectasis, infiltrate or chronic changes. 2.  Nonobstructed bowel gas pattern.   Original Report Authenticated By: Malachy Moan, M.D.   The patient has been rechecked x4. There has been a significant delay in [place]. The patient has been waiting on this testing for a while.   I spoke to surgery about the patient and they requested CT scan. I considered PE in the patient but she has no Tachycardia, hypoxia, or SOB. The patient is stable otherwise.   MDM  MDM Reviewed: vitals and nursing note Interpretation: labs, ECG, x-ray and ultrasound Consults: general surgery            Carlyle Dolly, PA-C 01/03/13 1638

## 2013-01-03 NOTE — ED Notes (Signed)
Pt c/o RUQ pain starting yesterday; pt sts hx of same with recent bowel perforation that hx sx for and now has colostomy; pt sts making output in colostomy; pt sts pain worse with palpitation

## 2013-01-03 NOTE — ED Provider Notes (Signed)
Medical screening examination/treatment/procedure(s) were conducted as a shared visit with non-physician practitioner(s) and myself.  I personally evaluated the patient during the encounter.  Pt with recent perf diverticulitis, s/p surgical correction x 2 weeks comes in with cc of RUQ pain. She had the same pain that led to the dx of diverticulitis. Current pain started 2 days ago, constant, severe. [place] ordered-  No abnormality, specifically with CBD. Labs are WNL. Pt has guarding over the RUQ, no fevers, 0 SIRS criteria. Highly doubt intra abd infection - and requested early f/u by surgery - who recommends CT abd ordered in the ED. We anticipate non specific free air with the study.   Derwood Kaplan, MD 01/03/13 1757

## 2013-01-03 NOTE — H&P (Signed)
Joanne Mora is an 47 y.o. female.   Chief Complaint: abdominal pain HPI: The pt is a 47 yo wf who is 2 weeks out from a sigmoid colectomy with colostomy. She was doing well at home until yesterday when she developed RUQ pain. She denies any fever. She denies any nausea or vomiting. Her ostomy has been productive. She came to ER where a CT shows a fluid collection near the ostomy that could be c/w an abscess but this is not the location of her pain  Past Medical History  Diagnosis Date  . Diverticulitis of large intestine with perforation 12/28/2012    Sigmoid colon.  . Ileus following gastrointestinal surgery 12/28/2012  . Tobacco use 12/28/2012  . BMI 31.0-31.9,adult 12/28/2012    Past Surgical History  Procedure Laterality Date  . Laparotomy  12/20/2012    Procedure: EXPLORATORY LAPAROTOMY;  Surgeon: [name], MD;  Location: [facility] OR;  Service: General;  Laterality: N/A;  . Colostomy revision  12/20/2012    Procedure: COLON RESECTION SIGMOID;  Surgeon: [name], MD;  Location: [facility] OR;  Service: General;  Laterality: N/A;  . Colostomy  12/20/2012    Procedure: COLOSTOMY;  Surgeon: [name], MD;  Location: [facility] OR;  Service: General;  Laterality: N/A;  . Tubal ligation      History reviewed. No pertinent family history. Social History:  reports that she has been smoking Cigarettes.  She has been smoking about 1.00 pack per day. She does not have any smokeless tobacco history on file. She reports that  drinks alcohol. She reports that she does not use illicit drugs.  Allergies: No Known Allergies   (Not in a hospital admission)  Results for orders placed during the hospital encounter of 01/03/13 (from the past 48 hour(s))  CBC WITH DIFFERENTIAL     Status: Abnormal   Collection Time    01/03/13  9:24 AM      Result Value Range   WBC 8.7  4.0 - 10.5 K/uL   RBC 3.65 (*) 3.87 - 5.11 MIL/uL   Hemoglobin 10.2 (*) 12.0 - 15.0 g/dL   HCT 96.0 (*) 45.4 - 09.8 %   MCV 88.8  78.0 - 100.0  fL   MCH 27.9  26.0 - 34.0 pg   MCHC 31.5  30.0 - 36.0 g/dL   RDW 11.9  14.7 - 82.9 %   Platelets 496 (*) 150 - 400 K/uL   Neutrophils Relative 76  43 - 77 %   Neutro Abs 6.6  1.7 - 7.7 K/uL   Lymphocytes Relative 13  12 - 46 %   Lymphs Abs 1.1  0.7 - 4.0 K/uL   Monocytes Relative 9  3 - 12 %   Monocytes Absolute 0.8  0.1 - 1.0 K/uL   Eosinophils Relative 2  0 - 5 %   Eosinophils Absolute 0.2  0.0 - 0.7 K/uL   Basophils Relative 0  0 - 1 %   Basophils Absolute 0.0  0.0 - 0.1 K/uL  COMPREHENSIVE METABOLIC PANEL     Status: Abnormal   Collection Time    01/03/13  9:24 AM      Result Value Range   Sodium 136  135 - 145 mEq/L   Potassium 3.7  3.5 - 5.1 mEq/L   Chloride 98  96 - 112 mEq/L   CO2 26  19 - 32 mEq/L   Glucose, Bld 117 (*) 70 - 99 mg/dL   BUN 4 (*) 6 - 23 mg/dL  Creatinine, Ser 0.55  0.50 - 1.10 mg/dL   Calcium 9.4  8.4 - 16.1 mg/dL   Total Protein 8.6 (*) 6.0 - 8.3 g/dL   Albumin 3.1 (*) 3.5 - 5.2 g/dL   AST 18  0 - 37 U/L   ALT 13  0 - 35 U/L   Alkaline Phosphatase 86  39 - 117 U/L   Total Bilirubin 0.2 (*) 0.3 - 1.2 mg/dL   GFR calc non Af Amer >90  >90 mL/min   GFR calc Af Amer >90  >90 mL/min   Comment:            The eGFR has been calculated     using the CKD EPI equation.     This calculation has not been     validated in all clinical     situations.     eGFR's persistently     <90 mL/min signify     possible Chronic Kidney Disease.  LIPASE, BLOOD     Status: None   Collection Time    01/03/13  9:24 AM      Result Value Range   Lipase 27  11 - 59 U/L   US Abdomen Complete  01/03/2013  *RADIOLOGY REPORT*  Clinical Data:  Right upper quadrant abdominal pain  COMPLETE ABDOMINAL ULTRASOUND  Comparison:  CT abdomen pelvis of 12/17/2012  Findings:  Gallbladder:  The gallbladder is visualized and only a small amount of gallbladder sludge is present.  No gallstones are noted.  There is no pain over the gallbladder with compression.  Common bile duct:  The  common bile duct is normal measuring 4.0 mm in diameter.  Liver:  The liver has a normal echogenic pattern.  No ductal dilatation is seen.  IVC:  Appears normal.  Pancreas:  No focal abnormality seen.  Spleen:  The spleen is normal measuring 8.0 cm sagittally.  Right Kidney:  No hydronephrosis is seen.  The right kidney measures 13.9 cm sagittally.  Left Kidney:  No hydronephrosis is noted.  The left kidney measures 14.2 cm.  Abdominal aorta:  The abdominal aorta is normal in caliber.  Bilateral pleural effusions are present.  IMPRESSION:  1.  Negative abdominal ultrasound.  No gallstones.  No ductal dilatation. 2.  Bilateral pleural effusions.   Original Report Authenticated By: Dwyane Dee, M.D.    Ct Abdomen Pelvis W Contrast  01/03/2013  *RADIOLOGY REPORT*  Clinical Data: Right sided abdominal pain for 2 days.  Colostomy for colon perforation 2 weeks ago.  CT ABDOMEN AND PELVIS WITH CONTRAST  Technique:  Multidetector CT imaging of the abdomen and pelvis was performed following the standard protocol during bolus administration of intravenous contrast.  Contrast: OMNIPAQUE IOHEXOL 300 MG/ML  SOLN  Comparison: Acute abdominal series same date.  Abdominal pelvic CT 12/17/2012.  Findings: There are new small bilateral pleural effusions associated with worsened atelectasis at both lung bases.  There is no significant pericardial fluid.  The liver, gallbladder, biliary system, pancreas, spleen, adrenal glands and kidneys demonstrate no significant findings.  Descending colostomy has been performed in the interval.  Lateral to the colostomy, there is a lenticular shaped peripheral enhancing fluid collection beneath the anterior abdominal wall which measures 8.6 x 3.9 cm transverse on image 64.  There is no enteric contrast or air within this collection.  This collection may have a thin neck extending inferiorly to the sigmoid colon suture line.  No other fluid collections or inflammatory changes are identified.  The uterus, bladder and adnexa appear stable.  There are postsurgical changes within the anterior abdominal wall.  There are no worrisome osseous findings.  IMPRESSION:  1.  Peripherally enhancing intraperitoneal fluid collection status post colostomy for ruptured diverticulitis is concerning for an abscess.  This may extend to the sigmoid colon suture line but does not contain any air. 2.  No evidence of bowel obstruction or recurrent perforation. 3.  Bilateral pleural effusions with associated bibasilar atelectasis.   Original Report Authenticated By: Carey Bullocks, M.D.    Dg Abd Acute W/chest  01/03/2013  *RADIOLOGY REPORT*  Clinical Data: Right upper quadrant pain/spasms  ACUTE ABDOMEN SERIES (ABDOMEN 2 VIEW & CHEST 1 VIEW)  Comparison: Prior CT abdomen/pelvis 12/17/2012  Findings: Patchy airspace opacities in the right greater than left lower lobes.  The remainder of the lungs are clear.  Cardiac and mediastinal contours within normal limits.  Nonspecific bowel gas pattern with a relative paucity of bowel gas. No evidence of free air or significant air-fluid levels on the upright view.  Elongated right lobe of the liver consistent with right else lobe.  No other organomegaly.  No definite calcifications.  Ostomy bag projects over the left lower quadrant. No acute osseous abnormality.  IMPRESSION:  1.  Patchy bibasilar air space disease may reflect atelectasis, infiltrate or chronic changes. 2.  Nonobstructed bowel gas pattern.   Original Report Authenticated By: Malachy Moan, M.D.     Review of Systems  Constitutional: Negative.   HENT: Negative.   Eyes: Negative.   Respiratory: Negative.   Cardiovascular: Negative.   Gastrointestinal: Positive for abdominal pain.  Genitourinary: Negative.   Musculoskeletal: Negative.   Skin: Negative.   Neurological: Negative.   Endo/Heme/Allergies: Negative.   Psychiatric/Behavioral: Negative.     Blood pressure 108/62, pulse 84, temperature 99.2 F  (37.3 C), temperature source Oral, resp. rate 16, last menstrual period 12/03/2012, SpO2 97.00%. Physical Exam  Constitutional: She is oriented to person, place, and time. She appears well-developed and well-nourished.  HENT:  Head: Normocephalic and atraumatic.  Eyes: Conjunctivae and EOM are normal. Pupils are equal, round, and reactive to light.  Neck: Normal range of motion. Neck supple.  Cardiovascular: Normal rate, regular rhythm and normal heart sounds.   Respiratory: Effort normal and breath sounds normal.  GI: Soft. Bowel sounds are normal.  Tender in RUQ. Ostomy productive. No peritonitis  Musculoskeletal: Normal range of motion.  Neurological: She is alert and oriented to person, place, and time.  Skin: Skin is warm and dry.  Psychiatric: She has a normal mood and affect. Her behavior is normal.     Assessment/Plan The pt has a possible intraabdominal abscess 2 weeks after sigmoid colectomy and colostomy for perforation although her pain is on the right. Will admit for IV abx and possible perc drain by IR  TOTH III,Jasdeep Kepner S 01/03/2013, 9:35 PM

## 2013-01-04 ENCOUNTER — Observation Stay (HOSPITAL_COMMUNITY): Payer: Self-pay

## 2013-01-04 ENCOUNTER — Encounter (HOSPITAL_COMMUNITY): Payer: Self-pay | Admitting: *Deleted

## 2013-01-04 ENCOUNTER — Other Ambulatory Visit (HOSPITAL_COMMUNITY): Payer: Self-pay

## 2013-01-04 LAB — CBC
MCHC: 31.3 g/dL (ref 30.0–36.0)
Platelets: 435 10*3/uL — ABNORMAL HIGH (ref 150–400)
RDW: 15.4 % (ref 11.5–15.5)
WBC: 8 10*3/uL (ref 4.0–10.5)

## 2013-01-04 LAB — BASIC METABOLIC PANEL
Calcium: 9.1 mg/dL (ref 8.4–10.5)
Creatinine, Ser: 0.57 mg/dL (ref 0.50–1.10)
GFR calc Af Amer: >90 mL/min (ref >90)

## 2013-01-04 LAB — APTT: aPTT: 40 seconds — ABNORMAL HIGH (ref 24–37)

## 2013-01-04 LAB — PROTIME-INR: INR: 1.13 (ref 0.00–1.49)

## 2013-01-04 MED ORDER — FENTANYL CITRATE 0.05 MG/ML IJ SOLN
INTRAMUSCULAR | Status: AC
  Filled 2013-01-04: qty 4

## 2013-01-04 MED ORDER — HYDROMORPHONE HCL PF 1 MG/ML IJ SOLN
2.0000 mg | INTRAMUSCULAR | Status: DC | PRN
  Administered 2013-01-04 – 2013-01-06 (×25): 2 mg via INTRAVENOUS
  Filled 2013-01-04 (×25): qty 2

## 2013-01-04 MED ORDER — ENOXAPARIN SODIUM 40 MG/0.4ML ~~LOC~~ SOLN
40.0000 mg | SUBCUTANEOUS | Status: DC
  Administered 2013-01-05 – 2013-01-06 (×2): 40 mg via SUBCUTANEOUS
  Filled 2013-01-04 (×3): qty 0.4

## 2013-01-04 MED ORDER — FENTANYL CITRATE 0.05 MG/ML IJ SOLN
INTRAMUSCULAR | Status: AC | PRN
  Administered 2013-01-04 (×2): 50 ug via INTRAVENOUS

## 2013-01-04 MED ORDER — HYDROCODONE-ACETAMINOPHEN 5-325 MG PO TABS
1.0000 | ORAL_TABLET | ORAL | Status: DC | PRN
  Administered 2013-01-04 – 2013-01-06 (×2): 2 via ORAL
  Filled 2013-01-04 (×3): qty 2

## 2013-01-04 MED ORDER — MIDAZOLAM HCL 2 MG/2ML IJ SOLN
INTRAMUSCULAR | Status: AC | PRN
  Administered 2013-01-04 (×2): 1 mg via INTRAVENOUS

## 2013-01-04 MED ORDER — MIDAZOLAM HCL 2 MG/2ML IJ SOLN
INTRAMUSCULAR | Status: AC
  Filled 2013-01-04: qty 4

## 2013-01-04 NOTE — Progress Notes (Signed)
Patient was bleeding at drainage site, saturated her old dressing, pressure applied and new dressing in situ, radiology PA made aware.

## 2013-01-04 NOTE — Procedures (Signed)
CT guided perc drain placement 65f LLQ 20ml serosanguinous out, for cx, g&s No complication No blood loss. See complete dictation in [facility].

## 2013-01-04 NOTE — H&P (Signed)
Joanne Mora is an 47 y.o. female.   Chief Complaint: diverticular colon perforation Colostomy 2 weeks ago New abd pain and fevers Ct shows pelvic abscess Scheduled now for pelvic abscess drain placement HPI: Diverticular perf; ileus; abscess  Past Medical History  Diagnosis Date  . Diverticulitis of large intestine with perforation 12/28/2012    Sigmoid colon.  . Ileus following gastrointestinal surgery 12/28/2012  . Tobacco use 12/28/2012  . BMI 31.0-31.9,adult 12/28/2012    Past Surgical History  Procedure Laterality Date  . Laparotomy  12/20/2012    Procedure: EXPLORATORY LAPAROTOMY;  Surgeon: [name], MD;  Location: [facility] OR;  Service: General;  Laterality: N/A;  . Colostomy revision  12/20/2012    Procedure: COLON RESECTION SIGMOID;  Surgeon: [name], MD;  Location: [facility] OR;  Service: General;  Laterality: N/A;  . Colostomy  12/20/2012    Procedure: COLOSTOMY;  Surgeon: [name], MD;  Location: [facility] OR;  Service: General;  Laterality: N/A;  . Tubal ligation      History reviewed. No pertinent family history. Social History:  reports that she has been smoking Cigarettes.  She has been smoking about 1.00 pack per day. She does not have any smokeless tobacco history on file. She reports that  drinks alcohol. She reports that she does not use illicit drugs.  Allergies: No Known Allergies  Medications Prior to Admission  Medication Sig Dispense Refill  . amoxicillin-clavulanate (AUGMENTIN) 875-125 MG per tablet Take 1 tablet by mouth 2 (two) times daily.      Marland Kitchen oxyCODONE-acetaminophen (PERCOCET) 10-325 MG per tablet Take 1 tablet by mouth every 4 (four) hours as needed for pain. For pain        Results for orders placed during the hospital encounter of 01/03/13 (from the past 48 hour(s))  CBC WITH DIFFERENTIAL     Status: Abnormal   Collection Time    01/03/13  9:24 AM      Result Value Range   WBC 8.7  4.0 - 10.5 K/uL   RBC 3.65 (*) 3.87 - 5.11 MIL/uL   Hemoglobin 10.2  (*) 12.0 - 15.0 g/dL   HCT 40.9 (*) 81.1 - 91.4 %   MCV 88.8  78.0 - 100.0 fL   MCH 27.9  26.0 - 34.0 pg   MCHC 31.5  30.0 - 36.0 g/dL   RDW 78.2  95.6 - 21.3 %   Platelets 496 (*) 150 - 400 K/uL   Neutrophils Relative 76  43 - 77 %   Neutro Abs 6.6  1.7 - 7.7 K/uL   Lymphocytes Relative 13  12 - 46 %   Lymphs Abs 1.1  0.7 - 4.0 K/uL   Monocytes Relative 9  3 - 12 %   Monocytes Absolute 0.8  0.1 - 1.0 K/uL   Eosinophils Relative 2  0 - 5 %   Eosinophils Absolute 0.2  0.0 - 0.7 K/uL   Basophils Relative 0  0 - 1 %   Basophils Absolute 0.0  0.0 - 0.1 K/uL  COMPREHENSIVE METABOLIC PANEL     Status: Abnormal   Collection Time    01/03/13  9:24 AM      Result Value Range   Sodium 136  135 - 145 mEq/L   Potassium 3.7  3.5 - 5.1 mEq/L   Chloride 98  96 - 112 mEq/L   CO2 26  19 - 32 mEq/L   Glucose, Bld 117 (*) 70 - 99 mg/dL   BUN 4 (*) 6 -  23 mg/dL   Creatinine, Ser 1.61  0.50 - 1.10 mg/dL   Calcium 9.4  8.4 - 09.6 mg/dL   Total Protein 8.6 (*) 6.0 - 8.3 g/dL   Albumin 3.1 (*) 3.5 - 5.2 g/dL   AST 18  0 - 37 U/L   ALT 13  0 - 35 U/L   Alkaline Phosphatase 86  39 - 117 U/L   Total Bilirubin 0.2 (*) 0.3 - 1.2 mg/dL   GFR calc non Af Amer >90  >90 mL/min   GFR calc Af Amer >90  >90 mL/min   Comment:            The eGFR has been calculated     using the CKD EPI equation.     This calculation has not been     validated in all clinical     situations.     eGFR's persistently     <90 mL/min signify     possible Chronic Kidney Disease.  LIPASE, BLOOD     Status: None   Collection Time    01/03/13  9:24 AM      Result Value Range   Lipase 27  11 - 59 U/L  BASIC METABOLIC PANEL     Status: Abnormal   Collection Time    01/04/13  6:40 AM      Result Value Range   Sodium 138  135 - 145 mEq/L   Potassium 3.8  3.5 - 5.1 mEq/L   Chloride 101  96 - 112 mEq/L   CO2 28  19 - 32 mEq/L   Glucose, Bld 106 (*) 70 - 99 mg/dL   BUN 6  6 - 23 mg/dL   Creatinine, Ser 0.45  0.50 - 1.10  mg/dL   Calcium 9.1  8.4 - 40.9 mg/dL   GFR calc non Af Amer >90  >90 mL/min   GFR calc Af Amer >90  >90 mL/min   Comment:            The eGFR has been calculated     using the CKD EPI equation.     This calculation has not been     validated in all clinical     situations.     eGFR's persistently     <90 mL/min signify     possible Chronic Kidney Disease.  CBC     Status: Abnormal   Collection Time    01/04/13  6:40 AM      Result Value Range   WBC 8.0  4.0 - 10.5 K/uL   RBC 3.39 (*) 3.87 - 5.11 MIL/uL   Hemoglobin 9.5 (*) 12.0 - 15.0 g/dL   HCT 81.1 (*) 91.4 - 78.2 %   MCV 89.7  78.0 - 100.0 fL   MCH 28.0  26.0 - 34.0 pg   MCHC 31.3  30.0 - 36.0 g/dL   RDW 95.6  21.3 - 08.6 %   Platelets 435 (*) 150 - 400 K/uL   US Abdomen Complete  01/03/2013  *RADIOLOGY REPORT*  Clinical Data:  Right upper quadrant abdominal pain  COMPLETE ABDOMINAL ULTRASOUND  Comparison:  CT abdomen pelvis of 12/17/2012  Findings:  Gallbladder:  The gallbladder is visualized and only a small amount of gallbladder sludge is present.  No gallstones are noted.  There is no pain over the gallbladder with compression.  Common bile duct:  The common bile duct is normal measuring 4.0 mm in diameter.  Liver:  The liver has a normal  echogenic pattern.  No ductal dilatation is seen.  IVC:  Appears normal.  Pancreas:  No focal abnormality seen.  Spleen:  The spleen is normal measuring 8.0 cm sagittally.  Right Kidney:  No hydronephrosis is seen.  The right kidney measures 13.9 cm sagittally.  Left Kidney:  No hydronephrosis is noted.  The left kidney measures 14.2 cm.  Abdominal aorta:  The abdominal aorta is normal in caliber.  Bilateral pleural effusions are present.  IMPRESSION:  1.  Negative abdominal ultrasound.  No gallstones.  No ductal dilatation. 2.  Bilateral pleural effusions.   Original Report Authenticated By: Dwyane Dee, M.D.    Ct Abdomen Pelvis W Contrast  01/03/2013  *RADIOLOGY REPORT*  Clinical Data: Right  sided abdominal pain for 2 days.  Colostomy for colon perforation 2 weeks ago.  CT ABDOMEN AND PELVIS WITH CONTRAST  Technique:  Multidetector CT imaging of the abdomen and pelvis was performed following the standard protocol during bolus administration of intravenous contrast.  Contrast: OMNIPAQUE IOHEXOL 300 MG/ML  SOLN  Comparison: Acute abdominal series same date.  Abdominal pelvic CT 12/17/2012.  Findings: There are new small bilateral pleural effusions associated with worsened atelectasis at both lung bases.  There is no significant pericardial fluid.  The liver, gallbladder, biliary system, pancreas, spleen, adrenal glands and kidneys demonstrate no significant findings.  Descending colostomy has been performed in the interval.  Lateral to the colostomy, there is a lenticular shaped peripheral enhancing fluid collection beneath the anterior abdominal wall which measures 8.6 x 3.9 cm transverse on image 64.  There is no enteric contrast or air within this collection.  This collection may have a thin neck extending inferiorly to the sigmoid colon suture line.  No other fluid collections or inflammatory changes are identified.  The uterus, bladder and adnexa appear stable.  There are postsurgical changes within the anterior abdominal wall.  There are no worrisome osseous findings.  IMPRESSION:  1.  Peripherally enhancing intraperitoneal fluid collection status post colostomy for ruptured diverticulitis is concerning for an abscess.  This may extend to the sigmoid colon suture line but does not contain any air. 2.  No evidence of bowel obstruction or recurrent perforation. 3.  Bilateral pleural effusions with associated bibasilar atelectasis.   Original Report Authenticated By: Carey Bullocks, M.D.    Dg Abd Acute W/chest  01/03/2013  *RADIOLOGY REPORT*  Clinical Data: Right upper quadrant pain/spasms  ACUTE ABDOMEN SERIES (ABDOMEN 2 VIEW & CHEST 1 VIEW)  Comparison: Prior CT abdomen/pelvis 12/17/2012   Findings: Patchy airspace opacities in the right greater than left lower lobes.  The remainder of the lungs are clear.  Cardiac and mediastinal contours within normal limits.  Nonspecific bowel gas pattern with a relative paucity of bowel gas. No evidence of free air or significant air-fluid levels on the upright view.  Elongated right lobe of the liver consistent with right else lobe.  No other organomegaly.  No definite calcifications.  Ostomy bag projects over the left lower quadrant. No acute osseous abnormality.  IMPRESSION:  1.  Patchy bibasilar air space disease may reflect atelectasis, infiltrate or chronic changes. 2.  Nonobstructed bowel gas pattern.   Original Report Authenticated By: Malachy Moan, M.D.     Review of Systems  Constitutional: Negative for fever.  Respiratory: Negative for shortness of breath.   Cardiovascular: Negative for chest pain.  Gastrointestinal: Positive for abdominal pain.  Neurological: Positive for weakness. Negative for dizziness.    Blood pressure 138/53, pulse 79,  temperature 98.3 F (36.8 C), temperature source Oral, resp. rate 17, height 5\' 10"  (1.778 m), weight 206 lb 9.6 oz (93.713 kg), last menstrual period 12/03/2012, SpO2 93.00%. Physical Exam  Constitutional: She is oriented to person, place, and time.  Cardiovascular: Normal rate and regular rhythm.   No murmur heard. Respiratory: Effort normal and breath sounds normal. She has no wheezes.  GI: Soft. Bowel sounds are normal. She exhibits distension. There is tenderness.  Musculoskeletal: Normal range of motion.  Neurological: She is alert and oriented to person, place, and time.  Psychiatric: She has a normal mood and affect. Her behavior is normal. Judgment and thought content normal.     Assessment/Plan Pelvic abscess post colostomy 2 weeks ago Scheduled for pelvic abscess drain placement Pt aware of procedure benefits and risks and agreeable to proceed Consent signed and in  chart  Erroll Wilbourne A 01/04/2013, 8:30 AM

## 2013-01-04 NOTE — Progress Notes (Signed)
Patient ID: Joanne Mora, female   DOB: 1966-05-24, 47 y.o.   MRN: [id number]    Subjective: Pt c/o severe pain in the RUQ and right flank, no problems with drain today, no pain there, denies nausea/vomiting  Objective: Vital signs in last 24 hours: Temp:  [97.5 F (36.4 C)-99.2 F (37.3 C)] 98.9 F (37.2 C) (02/10 1108) Pulse Rate:  [70-89] 70 (02/10 1108) Resp:  [13-23] 16 (02/10 1108) BP: (102-138)/(49-68) 118/52 mmHg (02/10 1108) SpO2:  [93 %-99 %] 97 % (02/10 1108) Weight:  [206 lb 9.6 oz (93.713 kg)] 206 lb 9.6 oz (93.713 kg) (02/10 0004) Last BM Date: 01/04/13  Intake/Output from previous day:   Intake/Output this shift: Total I/O In: 750 [I.V.:700; IV Piggyback:50] Out: -   PE: Abd: soft, tender esp in the right upper quad and flank, +BS, ostomy healthy with output General: appears in sharp pain at times and yells out  Lab Results:   Recent Labs  01/03/13 0924 01/04/13 0640  WBC 8.7 8.0  HGB 10.2* 9.5*  HCT 32.4* 30.4*  PLT 496* 435*   BMET  Recent Labs  01/03/13 0924 01/04/13 0640  NA 136 138  K 3.7 3.8  CL 98 101  CO2 26 28  GLUCOSE 117* 106*  BUN 4* 6  CREATININE 0.55 0.57  CALCIUM 9.4 9.1   PT/INR  Recent Labs  01/04/13 0640  LABPROT 14.3  INR 1.13   CMP     Component Value Date/Time   NA 138 01/04/2013 0640   K 3.8 01/04/2013 0640   CL 101 01/04/2013 0640   CO2 28 01/04/2013 0640   GLUCOSE 106* 01/04/2013 0640   BUN 6 01/04/2013 0640   CREATININE 0.57 01/04/2013 0640   CALCIUM 9.1 01/04/2013 0640   PROT 8.6* 01/03/2013 0924   ALBUMIN 3.1* 01/03/2013 0924   AST 18 01/03/2013 0924   ALT 13 01/03/2013 0924   ALKPHOS 86 01/03/2013 0924   BILITOT 0.2* 01/03/2013 0924   GFRNONAA >90 01/04/2013 0640   GFRAA >90 01/04/2013 0640   Lipase     Component Value Date/Time   LIPASE 27 01/03/2013 0924       Studies/Results: Ct Guided Abscess Drain  01/04/2013  *RADIOLOGY REPORT*  Clinical data:  Partial left colectomy secondary to perforated  diverticulum.  New abdominal pain.  Recent CT demonstrates peritoneal loculated fluid collection near the ostomy site.  CT-GUIDED PERITONEAL ABSCESS DRAINAGE CATHETER PLACEMENT  Comparison:  CT 01/03/2013  Technique and findings: The procedure, risks (including but not limited to bleeding, infection, organ damage), benefits, and alternatives were explained to the patient.  Questions regarding the procedure were encouraged and answered.  The patient understands and consents to the procedure.Select axial scans through the lower abdomen were obtained.  The fluid collection was localized and an appropriate skin entry site was determined. Operator donned sterile gloves and mask.   Site was marked, prepped with Betadine, draped in usual sterile fashion, infiltrated locally with 1% lidocaine.  Intravenous Fentanyl and Versed were administered as conscious sedation during continuous cardiorespiratory monitoring by the radiology RN, with a total moderate sedation time of 11 minutes.  Under CT fluoroscopic guidance, an 18 gauge percutaneous entry needle was advanced into the left lower quadrant collection.  Thin straw-colored fluid could be aspirated.  An Amplatz guide wire advanced easily within the collection, its position confirmed on CT fluoroscopy.  The tract was dilated to allow placement of 12-French pigtail catheter, placed within the dependent aspect of the collection.  Approximate 20 ml of serosanguinous fluid were aspirated, sent for routine Gram stain and culture.  The catheter was secured externally with O-Prolene suture and Statlock and placed to gravity bag. The patient tolerated the procedure well. No immediate complication.  IMPRESSION: Technically successful CT-guided left lower quadrant abscess drainage catheter placement.   Original Report Authenticated By: D. Andria Rhein, MD    US Abdomen Complete  01/03/2013  *RADIOLOGY REPORT*  Clinical Data:  Right upper quadrant abdominal pain  COMPLETE ABDOMINAL  ULTRASOUND  Comparison:  CT abdomen pelvis of 12/17/2012  Findings:  Gallbladder:  The gallbladder is visualized and only a small amount of gallbladder sludge is present.  No gallstones are noted.  There is no pain over the gallbladder with compression.  Common bile duct:  The common bile duct is normal measuring 4.0 mm in diameter.  Liver:  The liver has a normal echogenic pattern.  No ductal dilatation is seen.  IVC:  Appears normal.  Pancreas:  No focal abnormality seen.  Spleen:  The spleen is normal measuring 8.0 cm sagittally.  Right Kidney:  No hydronephrosis is seen.  The right kidney measures 13.9 cm sagittally.  Left Kidney:  No hydronephrosis is noted.  The left kidney measures 14.2 cm.  Abdominal aorta:  The abdominal aorta is normal in caliber.  Bilateral pleural effusions are present.  IMPRESSION:  1.  Negative abdominal ultrasound.  No gallstones.  No ductal dilatation. 2.  Bilateral pleural effusions.   Original Report Authenticated By: Dwyane Dee, M.D.    Ct Abdomen Pelvis W Contrast  01/03/2013  *RADIOLOGY REPORT*  Clinical Data: Right sided abdominal pain for 2 days.  Colostomy for colon perforation 2 weeks ago.  CT ABDOMEN AND PELVIS WITH CONTRAST  Technique:  Multidetector CT imaging of the abdomen and pelvis was performed following the standard protocol during bolus administration of intravenous contrast.  Contrast: OMNIPAQUE IOHEXOL 300 MG/ML  SOLN  Comparison: Acute abdominal series same date.  Abdominal pelvic CT 12/17/2012.  Findings: There are new small bilateral pleural effusions associated with worsened atelectasis at both lung bases.  There is no significant pericardial fluid.  The liver, gallbladder, biliary system, pancreas, spleen, adrenal glands and kidneys demonstrate no significant findings.  Descending colostomy has been performed in the interval.  Lateral to the colostomy, there is a lenticular shaped peripheral enhancing fluid collection beneath the anterior abdominal  wall which measures 8.6 x 3.9 cm transverse on image 64.  There is no enteric contrast or air within this collection.  This collection may have a thin neck extending inferiorly to the sigmoid colon suture line.  No other fluid collections or inflammatory changes are identified.  The uterus, bladder and adnexa appear stable.  There are postsurgical changes within the anterior abdominal wall.  There are no worrisome osseous findings.  IMPRESSION:  1.  Peripherally enhancing intraperitoneal fluid collection status post colostomy for ruptured diverticulitis is concerning for an abscess.  This may extend to the sigmoid colon suture line but does not contain any air. 2.  No evidence of bowel obstruction or recurrent perforation. 3.  Bilateral pleural effusions with associated bibasilar atelectasis.   Original Report Authenticated By: Carey Bullocks, M.D.    Dg Abd Acute W/chest  01/03/2013  *RADIOLOGY REPORT*  Clinical Data: Right upper quadrant pain/spasms  ACUTE ABDOMEN SERIES (ABDOMEN 2 VIEW & CHEST 1 VIEW)  Comparison: Prior CT abdomen/pelvis 12/17/2012  Findings: Patchy airspace opacities in the right greater than left lower lobes.  The  remainder of the lungs are clear.  Cardiac and mediastinal contours within normal limits.  Nonspecific bowel gas pattern with a relative paucity of bowel gas. No evidence of free air or significant air-fluid levels on the upright view.  Elongated right lobe of the liver consistent with right else lobe.  No other organomegaly.  No definite calcifications.  Ostomy bag projects over the left lower quadrant. No acute osseous abnormality.  IMPRESSION:  1.  Patchy bibasilar air space disease may reflect atelectasis, infiltrate or chronic changes. 2.  Nonobstructed bowel gas pattern.   Original Report Authenticated By: Malachy Moan, M.D.     Anti-infectives: Anti-infectives   Start     Dose/Rate Route Frequency Ordered Stop   01/04/13 0600  piperacillin-tazobactam (ZOSYN) IVPB  3.375 g     3.375 g 12.5 mL/hr over 240 Minutes Intravenous 3 times per day 01/03/13 2351     01/03/13 2015  piperacillin-tazobactam (ZOSYN) IVPB 3.375 g     3.375 g 100 mL/hr over 30 Minutes Intravenous  Once 01/03/13 2005 01/03/13 2155       Assessment/Plan 1. POD #15-Sigmoid colectomy and end colostomy with drainage of intraabdominal abscess, 12/20/12 Dr.Byerly: intraabdominal abscess on left side, drain placed by IR today, just serosang drainage right now, - on Zosyn.  Pt still complaining of severe, sharp, shooting pain on right flank and RUQ, unsure what is the cause of this, will continue abx, will ask Dr. Ezzard Standing about diet, ostomy ok.   2.  Still has right subcostal pain which catches when she breathes.  This was her primary presenting complaint.  She does have bibasilar atelectasis with small bilateral pleural effusions. 3.  DVT proph - Lovenox (to start in AM)   LOS: 1 day    WHITE, ELIZABETH 01/04/2013  Agree with above. She had some bleeding from the perc drain site this afternoon, better now.  Fluid from abscess is not very impressive.  Cultures pending.  Ovidio Kin, MD, [facility] Pager: [phone] Office phone:  [phone]

## 2013-01-04 NOTE — Care Management Note (Signed)
  Page 1 of 1   01/04/2013     10:16:16 AM   CARE MANAGEMENT NOTE 01/04/2013  Patient:  Joanne Mora, Joanne Mora   Account Number:  [id number]  Date Initiated:  01/04/2013  Documentation initiated by:  Ronny Flurry  Subjective/Objective Assessment:   possible intraabdominal abscess 2 weeks after sigmoid colectomy and colostomy for perforation     Action/Plan:   IV abx and possible perc drain by IR   Anticipated DC Date:     Anticipated DC Plan:           Choice offered to / List presented to:             Status of service:   Medicare Important Message given?   (If response is "NO", the following Medicare IM given date fields will be blank) Date Medicare IM given:   Date Additional Medicare IM given:    Discharge Disposition:    Per UR Regulation:    If discussed at Long Length of Stay Meetings, dates discussed:    Comments:   01-04-13 Patient was discharged 12-29-12 with home health provided through [facility] , however, Jodene Nam called today to report patient refused home health after she was discharged , therefore she was never seen by a home health nurse .  Ronny Flurry RN BSN [phone]

## 2013-01-05 ENCOUNTER — Inpatient Hospital Stay (HOSPITAL_COMMUNITY): Payer: Self-pay

## 2013-01-05 MED ORDER — PANTOPRAZOLE SODIUM 40 MG PO TBEC
40.0000 mg | DELAYED_RELEASE_TABLET | Freq: Every day | ORAL | Status: DC
  Administered 2013-01-05 – 2013-01-06 (×2): 40 mg via ORAL
  Filled 2013-01-05 (×2): qty 1

## 2013-01-05 MED ORDER — IOHEXOL 350 MG/ML SOLN
100.0000 mL | Freq: Once | INTRAVENOUS | Status: AC | PRN
  Administered 2013-01-05: 100 mL via INTRAVENOUS

## 2013-01-05 NOTE — Progress Notes (Signed)
Called pharmacy to verify that pt's AM dose of Lovenox is OK to give due to the aPTT of 40. Pharmacy OK'd dose.

## 2013-01-05 NOTE — Progress Notes (Signed)
INITIAL NUTRITION ASSESSMENT  DOCUMENTATION CODES Per approved criteria  -Obesity Unspecified -Non-severe malnutrition of acute illness   INTERVENTION: 1.  General healthful diet; encouraged intake of food and beverages.  Discussed intake and wt goals with pt.  Pt states she is able to order meals per her preferences.  Will continue to monitor for need for supplements.   NUTRITION DIAGNOSIS: Unintended wt change related to poor appetite as evidenced by pt with 15 lbs wt change.  Monitor:  1.  Food/Beverage; Continued PO intake >/=75% of meals with snacks as requested by pt.  Reason for Assessment: chest pain  47 y.o. female  Admitting Dx: <principal problem not specified>  ASSESSMENT: Pt admitted with chest pain.  Pt is 2 week post-op colostomy due to diverticular perforation with abscess.  Pt is s/p percutaneous drain placement for abscess drainage.  Also with pleural effusions.  Pt states she has lost ~15 lbs in the past month r/t to illness.  She states she was unable to eat for several days post-op due to NPO status (pt experienced post-op ileus).  She also reports her appetite has been decreased compared to her usual.   Pt is currently on Regular diet and consuming 75% meals.  She has snacks at bedside and states she is improving her intake.  Dicussed wt status as well as nutrition needs for healing with pt.  Pt qualifies for non-severe malnutrition of acute illness based on degree of wt loss (6.7% in <1 month) and poor PO intake not meeting 75% of estimated needs PTA.  Height: Ht Readings from Last 1 Encounters:  01/04/13 5\' 10"  (1.778 m)    Weight: Wt Readings from Last 1 Encounters:  01/04/13 206 lb 9.6 oz (93.713 kg)    Ideal Body Weight: 68.2 kg  % Ideal Body Weight: 137%  Wt Readings from Last 10 Encounters:  01/04/13 206 lb 9.6 oz (93.713 kg)  12/18/12 221 lb (100.245 kg)  12/18/12 221 lb (100.245 kg)    Usual Body Weight: 220 lbs per pt  % Usual Body  Weight: 93%  BMI:  Body mass index is 29.64 kg/(m^2).  Estimated Nutritional Needs: Kcal: 1860-2050 Protein: 81-95g Fluid: >1.8 L/day  Skin: intact  Diet Order: General  EDUCATION NEEDS: -Education needs addressed   Intake/Output Summary (Last 24 hours) at 01/05/13 1619 Last data filed at 01/05/13 0606  Gross per 24 hour  Intake 2746.78 ml  Output   1375 ml  Net 1371.78 ml    Last BM: small output from colostomy  Labs:   Recent Labs Lab 01/03/13 0924 01/04/13 0640  NA 136 138  K 3.7 3.8  CL 98 101  CO2 26 28  BUN 4* 6  CREATININE 0.55 0.57  CALCIUM 9.4 9.1  GLUCOSE 117* 106*    CBG (last 3)  No results found for this basename: GLUCAP,  in the last 72 hours  Scheduled Meds: . enoxaparin (LOVENOX) injection  40 mg Subcutaneous Q24H  . pantoprazole  40 mg Oral Daily  . piperacillin-tazobactam (ZOSYN)  IV  3.375 g Intravenous Q8H    Continuous Infusions: . dextrose 5 % and 0.9 % NaCl with KCl 20 mEq/L 100 mL/hr at 01/05/13 4098    Past Medical History  Diagnosis Date  . Diverticulitis of large intestine with perforation 12/28/2012    Sigmoid colon.  . Ileus following gastrointestinal surgery 12/28/2012  . Tobacco use 12/28/2012  . BMI 31.0-31.9,adult 12/28/2012    Past Surgical History  Procedure Laterality Date  .  Laparotomy  12/20/2012    Procedure: EXPLORATORY LAPAROTOMY;  Surgeon: [name], MD;  Location: [facility] OR;  Service: General;  Laterality: N/A;  . Colostomy revision  12/20/2012    Procedure: COLON RESECTION SIGMOID;  Surgeon: [name], MD;  Location: [facility] OR;  Service: General;  Laterality: N/A;  . Colostomy  12/20/2012    Procedure: COLOSTOMY;  Surgeon: [name], MD;  Location: [facility] OR;  Service: General;  Laterality: N/A;  . Tubal ligation      Loyce Dys, MS RD LDN Clinical Inpatient Dietitian Pager: [phone] Weekend/After hours pager: [phone]

## 2013-01-05 NOTE — Progress Notes (Signed)
Subjective: Alert. Afebrile. Vital signs stable. Tolerating diet. Good colostomy function.  Still has RUQ pain.  Abscess drainage is watery, serosanguineous. Gram stain negative.on IV Zosyn for presumed infection.  Objective: Vital signs in last 24 hours: Temp:  [98.1 F (36.7 C)-98.9 F (37.2 C)] 98.6 F (37 C) (02/11 0150) Pulse Rate:  [70-83] 74 (02/11 0150) Resp:  [14-23] 20 (02/11 0150) BP: (103-127)/(47-67) 107/55 mmHg (02/11 0150) SpO2:  [94 %-98 %] 94 % (02/11 0150) Last BM Date: 01/04/13  Intake/Output from previous day: 02/10 0701 - 02/11 0700 In: 3736.8 [P.O.:600; I.V.:2976.8; IV Piggyback:150] Out: 1075 [Urine:1000; Drains:25; Stool:50] Intake/Output this shift: Total I/O In: 2386.8 [I.V.:2276.8; Other:10; IV Piggyback:100] Out: 1075 [Urine:1000; Drains:25; Stool:50]  General appearance: alert. In no distress. Cooperative. GI: tender RUQ. Otherwise soft. Drain was serosanguineous drainage, does not look infected. Colostomy healthy with stool and air in bag.  Midline wound clean, packed.  Lab Results:   Recent Labs  01/03/13 0924 01/04/13 0640  WBC 8.7 8.0  HGB 10.2* 9.5*  HCT 32.4* 30.4*  PLT 496* 435*   BMET  Recent Labs  01/03/13 0924 01/04/13 0640  NA 136 138  K 3.7 3.8  CL 98 101  CO2 26 28  GLUCOSE 117* 106*  BUN 4* 6  CREATININE 0.55 0.57  CALCIUM 9.4 9.1   PT/INR  Recent Labs  01/04/13 0640  LABPROT 14.3  INR 1.13   ABG No results found for this basename: PHART, PCO2, PO2, HCO3,  in the last 72 hours  Studies/Results: Ct Guided Abscess Drain  01/04/2013  *RADIOLOGY REPORT*  Clinical data:  Partial left colectomy secondary to perforated diverticulum.  New abdominal pain.  Recent CT demonstrates peritoneal loculated fluid collection near the ostomy site.  CT-GUIDED PERITONEAL ABSCESS DRAINAGE CATHETER PLACEMENT  Comparison:  CT 01/03/2013  Technique and findings: The procedure, risks (including but not limited to bleeding,  infection, organ damage), benefits, and alternatives were explained to the patient.  Questions regarding the procedure were encouraged and answered.  The patient understands and consents to the procedure.Select axial scans through the lower abdomen were obtained.  The fluid collection was localized and an appropriate skin entry site was determined. Operator donned sterile gloves and mask.   Site was marked, prepped with Betadine, draped in usual sterile fashion, infiltrated locally with 1% lidocaine.  Intravenous Fentanyl and Versed were administered as conscious sedation during continuous cardiorespiratory monitoring by the radiology RN, with a total moderate sedation time of 11 minutes.  Under CT fluoroscopic guidance, an 18 gauge percutaneous entry needle was advanced into the left lower quadrant collection.  Thin straw-colored fluid could be aspirated.  An Amplatz guide wire advanced easily within the collection, its position confirmed on CT fluoroscopy.  The tract was dilated to allow placement of 12-French pigtail catheter, placed within the dependent aspect of the collection.  Approximate 20 ml of serosanguinous fluid were aspirated, sent for routine Gram stain and culture.  The catheter was secured externally with O-Prolene suture and Statlock and placed to gravity bag. The patient tolerated the procedure well. No immediate complication.  IMPRESSION: Technically successful CT-guided left lower quadrant abscess drainage catheter placement.   Original Report Authenticated By: D. Andria Rhein, MD    US Abdomen Complete  01/03/2013  *RADIOLOGY REPORT*  Clinical Data:  Right upper quadrant abdominal pain  COMPLETE ABDOMINAL ULTRASOUND  Comparison:  CT abdomen pelvis of 12/17/2012  Findings:  Gallbladder:  The gallbladder is visualized and only a small amount of gallbladder  sludge is present.  No gallstones are noted.  There is no pain over the gallbladder with compression.  Common bile duct:  The common bile  duct is normal measuring 4.0 mm in diameter.  Liver:  The liver has a normal echogenic pattern.  No ductal dilatation is seen.  IVC:  Appears normal.  Pancreas:  No focal abnormality seen.  Spleen:  The spleen is normal measuring 8.0 cm sagittally.  Right Kidney:  No hydronephrosis is seen.  The right kidney measures 13.9 cm sagittally.  Left Kidney:  No hydronephrosis is noted.  The left kidney measures 14.2 cm.  Abdominal aorta:  The abdominal aorta is normal in caliber.  Bilateral pleural effusions are present.  IMPRESSION:  1.  Negative abdominal ultrasound.  No gallstones.  No ductal dilatation. 2.  Bilateral pleural effusions.   Original Report Authenticated By: Dwyane Dee, M.D.    Ct Abdomen Pelvis W Contrast  01/03/2013  *RADIOLOGY REPORT*  Clinical Data: Right sided abdominal pain for 2 days.  Colostomy for colon perforation 2 weeks ago.  CT ABDOMEN AND PELVIS WITH CONTRAST  Technique:  Multidetector CT imaging of the abdomen and pelvis was performed following the standard protocol during bolus administration of intravenous contrast.  Contrast: OMNIPAQUE IOHEXOL 300 MG/ML  SOLN  Comparison: Acute abdominal series same date.  Abdominal pelvic CT 12/17/2012.  Findings: There are new small bilateral pleural effusions associated with worsened atelectasis at both lung bases.  There is no significant pericardial fluid.  The liver, gallbladder, biliary system, pancreas, spleen, adrenal glands and kidneys demonstrate no significant findings.  Descending colostomy has been performed in the interval.  Lateral to the colostomy, there is a lenticular shaped peripheral enhancing fluid collection beneath the anterior abdominal wall which measures 8.6 x 3.9 cm transverse on image 64.  There is no enteric contrast or air within this collection.  This collection may have a thin neck extending inferiorly to the sigmoid colon suture line.  No other fluid collections or inflammatory changes are identified.  The uterus,  bladder and adnexa appear stable.  There are postsurgical changes within the anterior abdominal wall.  There are no worrisome osseous findings.  IMPRESSION:  1.  Peripherally enhancing intraperitoneal fluid collection status post colostomy for ruptured diverticulitis is concerning for an abscess.  This may extend to the sigmoid colon suture line but does not contain any air. 2.  No evidence of bowel obstruction or recurrent perforation. 3.  Bilateral pleural effusions with associated bibasilar atelectasis.   Original Report Authenticated By: Carey Bullocks, M.D.    Dg Abd Acute W/chest  01/03/2013  *RADIOLOGY REPORT*  Clinical Data: Right upper quadrant pain/spasms  ACUTE ABDOMEN SERIES (ABDOMEN 2 VIEW & CHEST 1 VIEW)  Comparison: Prior CT abdomen/pelvis 12/17/2012  Findings: Patchy airspace opacities in the right greater than left lower lobes.  The remainder of the lungs are clear.  Cardiac and mediastinal contours within normal limits.  Nonspecific bowel gas pattern with a relative paucity of bowel gas. No evidence of free air or significant air-fluid levels on the upright view.  Elongated right lobe of the liver consistent with right else lobe.  No other organomegaly.  No definite calcifications.  Ostomy bag projects over the left lower quadrant. No acute osseous abnormality.  IMPRESSION:  1.  Patchy bibasilar air space disease may reflect atelectasis, infiltrate or chronic changes. 2.  Nonobstructed bowel gas pattern.   Original Report Authenticated By: Malachy Moan, M.D.     Anti-infectives: Anti-infectives  Start     Dose/Rate Route Frequency Ordered Stop   01/04/13 0600  piperacillin-tazobactam (ZOSYN) IVPB 3.375 g     3.375 g 12.5 mL/hr over 240 Minutes Intravenous 3 times per day 01/03/13 2351     01/03/13 2015  piperacillin-tazobactam (ZOSYN) IVPB 3.375 g     3.375 g 100 mL/hr over 30 Minutes Intravenous  Once 01/03/13 2005 01/03/13 2155      Assessment/Plan: 1. POD #16-Sigmoid  colectomy and end colostomy with drainage of intraabdominal abscess, 12/20/12 Dr.Byerly: intraabdominal abscess on left side, drain placed by IR today, just serosang drainage right now, gram stain neg., - on Zosyn.   Pt still complaining of severe, sharp, shooting pain on right flank and RUQ, unsure what is the cause of this, will continue abx, regular diet, ambulate.     2. Still has right subcostal pain which catches when she breathes. This was her primary presenting complaint.  She does have bibasilar atelectasis with small bilateral pleural effusions. SpO2 98% on room air. I have a low index of suspicion for pulmonary embolism, but because of persistent symptoms, will go ahead with a CT Angio chest.  3. DVT proph - Lovenox (to start in AM)     LOS: 2 days    Shanecia Hoganson M. Derrell Lolling, M.D., [facility] General and Minimally invasive Surgery Breast and Colorectal Surgery Office:   [phone] Pager:   [phone]  01/05/2013

## 2013-01-05 NOTE — Progress Notes (Signed)
Subjective: Pt still with intermittent pain under rt breast (? pleuritic/diaphragmatic irritation from assoc effusion), CT angio chest - for PE ; no dyspnea, sig abd pain,  N/V.  Objective: Vital signs in last 24 hours: Temp:  [98.1 F (36.7 C)-98.9 F (37.2 C)] 98.3 F (36.8 C) (02/11 0600) Pulse Rate:  [70-81] 75 (02/11 0600) Resp:  [14-23] 18 (02/11 0600) BP: (107-127)/(47-67) 113/52 mmHg (02/11 0600) SpO2:  [94 %-99 %] 99 % (02/11 0600) Last BM Date: 01/04/13  Intake/Output from previous day: 02/10 0701 - 02/11 0700 In: 3736.8 [P.O.:600; I.V.:2976.8; IV Piggyback:150] Out: 1375 [Urine:1300; Drains:25; Stool:50] Intake/Output this shift:    LLQ drain intact, output about 25 cc's today, small amt dried blood around insertion site, mildly tender to palpation, blood-tinged serous  fluid in bag; cx's pending  Lab Results:   Recent Labs  01/03/13 0924 01/04/13 0640  WBC 8.7 8.0  HGB 10.2* 9.5*  HCT 32.4* 30.4*  PLT 496* 435*   BMET  Recent Labs  01/03/13 0924 01/04/13 0640  NA 136 138  K 3.7 3.8  CL 98 101  CO2 26 28  GLUCOSE 117* 106*  BUN 4* 6  CREATININE 0.55 0.57  CALCIUM 9.4 9.1   PT/INR  Recent Labs  01/04/13 0640  LABPROT 14.3  INR 1.13   ABG No results found for this basename: PHART, PCO2, PO2, HCO3,  in the last 72 hours  Studies/Results: Ct Angio Chest Pe W/cm &/or Wo Cm  01/05/2013  *RADIOLOGY REPORT*  Clinical Data: Right upper quadrant pain, right chest pain, concern for pulmonary embolism  CT ANGIOGRAPHY CHEST  Technique:  Multidetector CT imaging of the chest using the standard protocol during bolus administration of intravenous contrast. Multiplanar reconstructed images including MIPs were obtained and reviewed to evaluate the vascular anatomy.  Contrast: OMNIPAQUE IOHEXOL 350 MG/ML SOLN  Comparison: CT to 07/2013  Findings: There are no filling defects within the pulmonary arteries to suggest acute pulmonary embolism.  No acute  findings of the aorta or great vessels.  No pericardial fluid.  Esophagus is normal.  No axillary supraclavicular lymphadenopathy.  No mediastinal adenopathy.  There are bilateral small to moderate pleural effusions with dense bibasilar atelectasis which appears to represent passive atelectasis.  No pneumothorax.  Airways are normal.  Limited view of the upper abdomen is unremarkable.  Limited view of the skeleton is unremarkable.  IMPRESSION:  1.  No evidence of pulmonary embolism. 2.  Bilateral small to moderate pleural effusions with dense bibasilar atelectasis.   Original Report Authenticated By: Genevive Bi, M.D.    Ct Guided Abscess Drain  01/04/2013  *RADIOLOGY REPORT*  Clinical data:  Partial left colectomy secondary to perforated diverticulum.  New abdominal pain.  Recent CT demonstrates peritoneal loculated fluid collection near the ostomy site.  CT-GUIDED PERITONEAL ABSCESS DRAINAGE CATHETER PLACEMENT  Comparison:  CT 01/03/2013  Technique and findings: The procedure, risks (including but not limited to bleeding, infection, organ damage), benefits, and alternatives were explained to the patient.  Questions regarding the procedure were encouraged and answered.  The patient understands and consents to the procedure.Select axial scans through the lower abdomen were obtained.  The fluid collection was localized and an appropriate skin entry site was determined. Operator donned sterile gloves and mask.   Site was marked, prepped with Betadine, draped in usual sterile fashion, infiltrated locally with 1% lidocaine.  Intravenous Fentanyl and Versed were administered as conscious sedation during continuous cardiorespiratory monitoring by the radiology RN, with a total  moderate sedation time of 11 minutes.  Under CT fluoroscopic guidance, an 18 gauge percutaneous entry needle was advanced into the left lower quadrant collection.  Thin straw-colored fluid could be aspirated.  An Amplatz guide wire advanced  easily within the collection, its position confirmed on CT fluoroscopy.  The tract was dilated to allow placement of 12-French pigtail catheter, placed within the dependent aspect of the collection.  Approximate 20 ml of serosanguinous fluid were aspirated, sent for routine Gram stain and culture.  The catheter was secured externally with O-Prolene suture and Statlock and placed to gravity bag. The patient tolerated the procedure well. No immediate complication.  IMPRESSION: Technically successful CT-guided left lower quadrant abscess drainage catheter placement.   Original Report Authenticated By: D. Andria Rhein, MD    US Abdomen Complete  01/03/2013  *RADIOLOGY REPORT*  Clinical Data:  Right upper quadrant abdominal pain  COMPLETE ABDOMINAL ULTRASOUND  Comparison:  CT abdomen pelvis of 12/17/2012  Findings:  Gallbladder:  The gallbladder is visualized and only a small amount of gallbladder sludge is present.  No gallstones are noted.  There is no pain over the gallbladder with compression.  Common bile duct:  The common bile duct is normal measuring 4.0 mm in diameter.  Liver:  The liver has a normal echogenic pattern.  No ductal dilatation is seen.  IVC:  Appears normal.  Pancreas:  No focal abnormality seen.  Spleen:  The spleen is normal measuring 8.0 cm sagittally.  Right Kidney:  No hydronephrosis is seen.  The right kidney measures 13.9 cm sagittally.  Left Kidney:  No hydronephrosis is noted.  The left kidney measures 14.2 cm.  Abdominal aorta:  The abdominal aorta is normal in caliber.  Bilateral pleural effusions are present.  IMPRESSION:  1.  Negative abdominal ultrasound.  No gallstones.  No ductal dilatation. 2.  Bilateral pleural effusions.   Original Report Authenticated By: Dwyane Dee, M.D.    Ct Abdomen Pelvis W Contrast  01/03/2013  *RADIOLOGY REPORT*  Clinical Data: Right sided abdominal pain for 2 days.  Colostomy for colon perforation 2 weeks ago.  CT ABDOMEN AND PELVIS WITH CONTRAST   Technique:  Multidetector CT imaging of the abdomen and pelvis was performed following the standard protocol during bolus administration of intravenous contrast.  Contrast: OMNIPAQUE IOHEXOL 300 MG/ML  SOLN  Comparison: Acute abdominal series same date.  Abdominal pelvic CT 12/17/2012.  Findings: There are new small bilateral pleural effusions associated with worsened atelectasis at both lung bases.  There is no significant pericardial fluid.  The liver, gallbladder, biliary system, pancreas, spleen, adrenal glands and kidneys demonstrate no significant findings.  Descending colostomy has been performed in the interval.  Lateral to the colostomy, there is a lenticular shaped peripheral enhancing fluid collection beneath the anterior abdominal wall which measures 8.6 x 3.9 cm transverse on image 64.  There is no enteric contrast or air within this collection.  This collection may have a thin neck extending inferiorly to the sigmoid colon suture line.  No other fluid collections or inflammatory changes are identified.  The uterus, bladder and adnexa appear stable.  There are postsurgical changes within the anterior abdominal wall.  There are no worrisome osseous findings.  IMPRESSION:  1.  Peripherally enhancing intraperitoneal fluid collection status post colostomy for ruptured diverticulitis is concerning for an abscess.  This may extend to the sigmoid colon suture line but does not contain any air. 2.  No evidence of bowel obstruction or recurrent perforation.  3.  Bilateral pleural effusions with associated bibasilar atelectasis.   Original Report Authenticated By: Carey Bullocks, M.D.    Dg Abd Acute W/chest  01/03/2013  *RADIOLOGY REPORT*  Clinical Data: Right upper quadrant pain/spasms  ACUTE ABDOMEN SERIES (ABDOMEN 2 VIEW & CHEST 1 VIEW)  Comparison: Prior CT abdomen/pelvis 12/17/2012  Findings: Patchy airspace opacities in the right greater than left lower lobes.  The remainder of the lungs are clear.   Cardiac and mediastinal contours within normal limits.  Nonspecific bowel gas pattern with a relative paucity of bowel gas. No evidence of free air or significant air-fluid levels on the upright view.  Elongated right lobe of the liver consistent with right else lobe.  No other organomegaly.  No definite calcifications.  Ostomy bag projects over the left lower quadrant. No acute osseous abnormality.  IMPRESSION:  1.  Patchy bibasilar air space disease may reflect atelectasis, infiltrate or chronic changes. 2.  Nonobstructed bowel gas pattern.   Original Report Authenticated By: Malachy Moan, M.D.     Anti-infectives: Anti-infectives   Start     Dose/Rate Route Frequency Ordered Stop   01/04/13 0600  piperacillin-tazobactam (ZOSYN) IVPB 3.375 g     3.375 g 12.5 mL/hr over 240 Minutes Intravenous 3 times per day 01/03/13 2351     01/03/13 2015  piperacillin-tazobactam (ZOSYN) IVPB 3.375 g     3.375 g 100 mL/hr over 30 Minutes Intravenous  Once 01/03/13 2005 01/03/13 2155      Assessment/Plan: s/p LLQ abscess drainage 2/10. Check final cx's, monitor labs and effusions. Once drain output declines check f/u CT .  LOS: 2 days    ALLRED,D [facility] 01/05/2013

## 2013-01-06 MED ORDER — OXYCODONE-ACETAMINOPHEN 10-325 MG PO TABS: 1.0000 | ORAL_TABLET | ORAL | Status: DC | PRN

## 2013-01-06 MED ORDER — FLUCONAZOLE 200 MG PO TABS
200.0000 mg | ORAL_TABLET | Freq: Once | ORAL | Status: AC
  Administered 2013-01-06: 200 mg via ORAL
  Filled 2013-01-06: qty 1

## 2013-01-06 MED ORDER — OXYCODONE-ACETAMINOPHEN 5-325 MG PO TABS
2.0000 | ORAL_TABLET | ORAL | Status: DC | PRN
  Administered 2013-01-06 (×2): 2 via ORAL
  Filled 2013-01-06 (×2): qty 2

## 2013-01-06 NOTE — Progress Notes (Signed)
Patient ID: Joanne Mora, female   DOB: 1966-07-26, 47 y.o.   MRN: [id number] Per [facility] request, pt's LLQ drain removed in its entirety without immediate complications. Gauze dressing applied to site.

## 2013-01-06 NOTE — Progress Notes (Signed)
Pt discharged to home discharge instructions explained pt verbalized understanding 

## 2013-01-06 NOTE — Discharge Summary (Signed)
Patient ID: TAMETHA BANNING MRN: [id number] DOB/AGE: [birth year] 45 y.o.  Admit date: 01/03/2013 Discharge date: 01/06/2013  Procedures: none  Consults: None  Reason for Admission: The pt is a 47 yo wf who is 2 weeks out from a sigmoid colectomy with colostomy. She was doing well at home until yesterday when she developed RUQ pain. She denies any fever. She denies any nausea or vomiting. Her ostomy has been productive. She came to ER where a CT shows a fluid collection near the ostomy that could be c/w an abscess but this is not the location of her pain  Admission Diagnoses:  1. S/p sigmoid colectomy/colostomy  2. Intraabdominal fluid collection  Hospital Course: The patient was admitted and placed on IV Zosyn due to this finding of a fluid collection.  Most of her pain was RUQ, but this was located in the LUQ.  We asked radiology to see if they could drain this.  They were able to, but this was just serous and cultures have been negative to date.  Her drain had been placed for 3 days and only about 25cc of output.  Due to a negative culture, this was removed prior to discharge.  She otherwise was eating a regular diet and her pain was well controlled on Percocet 10/325mg .  She did c/o a vaginal yeast infection that she was given diflucan for prior to discharge.  She was otherwise stable for dc home.  Discharge Diagnoses:  1. S/p sigmoid colectomy and colostomy 2. Intraabdominal fluid collection, sterile, s/p perc drain and subsequent removal  Discharge Medications:   Medication List    STOP taking these medications       amoxicillin-clavulanate 875-125 MG per tablet  Commonly known as:  AUGMENTIN      TAKE these medications       oxyCODONE-acetaminophen 10-325 MG per tablet  Commonly known as:  PERCOCET  Take 1 tablet by mouth every 4 (four) hours as needed for pain. For pain        Discharge Instructions:     Follow-up Information   Follow up with Ernestene Mention, MD In 3  weeks.   Contact information:   [address] [place] [address] [phone]       Signed: Letha Cape 01/06/2013, 1:12 PM  Agree with above. Currently snowing fairly heavily.  Ovidio Kin, MD, [facility] Pager: [phone] Office phone:  [phone]

## 2013-01-06 NOTE — Progress Notes (Signed)
Patient ID: Joanne Mora, female   DOB: [birth year], 47 y.o.   MRN: [id number]    Subjective: Pt feels ok.  Pain controlled.  Switching to oral pain meds.  Dc zosyn given no infection.  C/o yeast infection.  Tolerating a regular diet.  Objective: Vital signs in last 24 hours: Temp:  [97.6 F (36.4 C)-98.3 F (36.8 C)] 98 F (36.7 C) (02/12 0636) Pulse Rate:  [54-68] 54 (02/12 0636) Resp:  [18-20] 18 (02/12 0636) BP: (109-119)/(51-59) 109/59 mmHg (02/12 0636) SpO2:  [95 %-98 %] 96 % (02/12 0636) Last BM Date: 01/05/13  Intake/Output from previous day: 02/11 0701 - 02/12 0700 In: 2536.7 [P.O.:960; I.V.:1566.7] Out: -  Intake/Output this shift:    PE: Abd: soft, drain with just serous drainage, ostomy with appropriate output.  Midline wound is clean.  Lab Results:   Recent Labs  01/03/13 0924 01/04/13 0640  WBC 8.7 8.0  HGB 10.2* 9.5*  HCT 32.4* 30.4*  PLT 496* 435*   BMET  Recent Labs  01/03/13 0924 01/04/13 0640  NA 136 138  K 3.7 3.8  CL 98 101  CO2 26 28  GLUCOSE 117* 106*  BUN 4* 6  CREATININE 0.55 0.57  CALCIUM 9.4 9.1   PT/INR  Recent Labs  01/04/13 0640  LABPROT 14.3  INR 1.13   CMP     Component Value Date/Time   NA 138 01/04/2013 0640   K 3.8 01/04/2013 0640   CL 101 01/04/2013 0640   CO2 28 01/04/2013 0640   GLUCOSE 106* 01/04/2013 0640   BUN 6 01/04/2013 0640   CREATININE 0.57 01/04/2013 0640   CALCIUM 9.1 01/04/2013 0640   PROT 8.6* 01/03/2013 0924   ALBUMIN 3.1* 01/03/2013 0924   AST 18 01/03/2013 0924   ALT 13 01/03/2013 0924   ALKPHOS 86 01/03/2013 0924   BILITOT 0.2* 01/03/2013 0924   GFRNONAA >90 01/04/2013 0640   GFRAA >90 01/04/2013 0640   Lipase     Component Value Date/Time   LIPASE 27 01/03/2013 0924       Studies/Results: Ct Angio Chest Pe W/cm &/or Wo Cm  01/05/2013  *RADIOLOGY REPORT*  Clinical Data: Right upper quadrant pain, right chest pain, concern for pulmonary embolism  CT ANGIOGRAPHY CHEST  Technique:  Multidetector CT  imaging of the chest using the standard protocol during bolus administration of intravenous contrast. Multiplanar reconstructed images including MIPs were obtained and reviewed to evaluate the vascular anatomy.  Contrast: OMNIPAQUE IOHEXOL 350 MG/ML SOLN  Comparison: CT to 07/2013  Findings: There are no filling defects within the pulmonary arteries to suggest acute pulmonary embolism.  No acute findings of the aorta or great vessels.  No pericardial fluid.  Esophagus is normal.  No axillary supraclavicular lymphadenopathy.  No mediastinal adenopathy.  There are bilateral small to moderate pleural effusions with dense bibasilar atelectasis which appears to represent passive atelectasis.  No pneumothorax.  Airways are normal.  Limited view of the upper abdomen is unremarkable.  Limited view of the skeleton is unremarkable.  IMPRESSION:  1.  No evidence of pulmonary embolism. 2.  Bilateral small to moderate pleural effusions with dense bibasilar atelectasis.   Original Report Authenticated By: Genevive Bi, M.D.    Ct Guided Abscess Drain  01/04/2013  *RADIOLOGY REPORT*  Clinical data:  Partial left colectomy secondary to perforated diverticulum.  New abdominal pain.  Recent CT demonstrates peritoneal loculated fluid collection near the ostomy site.  CT-GUIDED PERITONEAL ABSCESS DRAINAGE CATHETER PLACEMENT  Comparison:  CT 01/03/2013  Technique and findings: The procedure, risks (including but not limited to bleeding, infection, organ damage), benefits, and alternatives were explained to the patient.  Questions regarding the procedure were encouraged and answered.  The patient understands and consents to the procedure.Select axial scans through the lower abdomen were obtained.  The fluid collection was localized and an appropriate skin entry site was determined. Operator donned sterile gloves and mask.   Site was marked, prepped with Betadine, draped in usual sterile fashion, infiltrated locally with 1%  lidocaine.  Intravenous Fentanyl and Versed were administered as conscious sedation during continuous cardiorespiratory monitoring by the radiology RN, with a total moderate sedation time of 11 minutes.  Under CT fluoroscopic guidance, an 18 gauge percutaneous entry needle was advanced into the left lower quadrant collection.  Thin straw-colored fluid could be aspirated.  An Amplatz guide wire advanced easily within the collection, its position confirmed on CT fluoroscopy.  The tract was dilated to allow placement of 12-French pigtail catheter, placed within the dependent aspect of the collection.  Approximate 20 ml of serosanguinous fluid were aspirated, sent for routine Gram stain and culture.  The catheter was secured externally with O-Prolene suture and Statlock and placed to gravity bag. The patient tolerated the procedure well. No immediate complication.  IMPRESSION: Technically successful CT-guided left lower quadrant abscess drainage catheter placement.   Original Report Authenticated By: D. Andria Rhein, MD     Anti-infectives: Anti-infectives   Start     Dose/Rate Route Frequency Ordered Stop   01/06/13 0845  fluconazole (DIFLUCAN) tablet 200 mg     200 mg Oral  Once 01/06/13 0842     01/04/13 0600  piperacillin-tazobactam (ZOSYN) IVPB 3.375 g  Status:  Discontinued     3.375 g 12.5 mL/hr over 240 Minutes Intravenous 3 times per day 01/03/13 2351 01/06/13 0842   01/03/13 2015  piperacillin-tazobactam (ZOSYN) IVPB 3.375 g     3.375 g 100 mL/hr over 30 Minutes Intravenous  Once 01/03/13 2005 01/03/13 2155     Assessment/Plan  1. RUQ Abdominal pain,   s/p colectomy and colostomy - 12/17/2012 - F. [facility] 2.  LUQ Abdominal fluid collection, sterile, s/p perc drain  Plan: 1. Will dc drain today as her culture is negative. 2. Stop IV abx and Iv pain meds today 3. Give diflucan for yeast infection 4. Patient is tolerating a regular diet and comfortable.  She is stable for dc home.  Will  anticipate dc later today.   LOS: 3 days    OSBORNE,KELLY E 01/06/2013, 8:45 AM Pager: [phone]  Agree with above.  Ovidio Kin, MD, [facility] Pager: [phone] Office phone:  [phone]

## 2013-01-07 ENCOUNTER — Other Ambulatory Visit (HOSPITAL_COMMUNITY): Payer: Self-pay | Admitting: General Surgery

## 2013-01-07 LAB — CULTURE, ROUTINE-ABSCESS: Gram Stain: NONE SEEN

## 2013-01-11 ENCOUNTER — Telehealth (INDEPENDENT_AMBULATORY_CARE_PROVIDER_SITE_OTHER): Payer: Self-pay | Admitting: General Surgery

## 2013-01-11 NOTE — Telephone Encounter (Signed)
Pt called for refill of pain meds.  Paged and updated Dr. Donell Beers; ordered Percocet 10/325 mg, # 50, 1 po Q4H prn pain, no refill.  Dr Derrell Lolling signed the Rx for pick up at the front desk.  Pt aware.

## 2013-01-18 ENCOUNTER — Telehealth (INDEPENDENT_AMBULATORY_CARE_PROVIDER_SITE_OTHER): Payer: Self-pay | Admitting: *Deleted

## 2013-01-18 NOTE — Telephone Encounter (Signed)
Patient called to request refill on her pain medication.  Spoke to [facility] MD who approved Norco 5/325mg  1-2 tab every 4-6 hours as needed for pain #40, 1 refill.  Called into [facility] [phone].  Patient made aware

## 2013-01-22 ENCOUNTER — Encounter (INDEPENDENT_AMBULATORY_CARE_PROVIDER_SITE_OTHER): Payer: Self-pay | Admitting: General Surgery

## 2013-01-22 ENCOUNTER — Ambulatory Visit (INDEPENDENT_AMBULATORY_CARE_PROVIDER_SITE_OTHER): Payer: Self-pay | Admitting: General Surgery

## 2013-01-22 VITALS — BP 146/92 | HR 74 | Temp 98.6°F | Resp 18 | Ht 70.0 in | Wt 206.2 lb

## 2013-01-22 DIAGNOSIS — K5732 Diverticulitis of large intestine without perforation or abscess without bleeding: Secondary | ICD-10-CM

## 2013-01-22 DIAGNOSIS — K5720 Diverticulitis of large intestine with perforation and abscess without bleeding: Secondary | ICD-10-CM

## 2013-01-22 NOTE — Patient Instructions (Signed)
No lifting over 30 pounds for a total of 3 months post op.  Follow up in 4-5 weeks.  Wound can be changed once daily.  Will probably only need to change it for another week or so.

## 2013-01-22 NOTE — Assessment & Plan Note (Signed)
No evidence of surgical complications. Continue wound packing for around a week. Continue ostomy care. I will see the patient back in 4 weeks to examine her wound. I will see her back around 2 months after that and then we'll discuss ostomy takedown. I advised her that she must be off narcotics, must be back to normal energy level, and that the recovery will be similar to the recovery from the surgery.

## 2013-01-22 NOTE — Progress Notes (Signed)
HISTORY: Patient is a 47 year old female who is around 5 weeks status post Hartman's procedure for perforated diverticulitis.  She is not having fevers or chills. She is still having a significant amount of discomfort. She is eating and having normal ostomy output. She has some burning around the ostomy, but has been placing aloe on this.  She has been changing her midline dressing twice daily. Her energy level is slowly improving. We gave her Vicodin, and this is not relieving her pain. This   EXAM: General:  Alert and oriented Incision:  Upper half completely granulated, lower half still with small amount granulation tissue to go.     PATHOLOGY: C/w perforated diverticulitis   ASSESSMENT AND PLAN:   Diverticulitis of large intestine with perforation No evidence of surgical complications. Continue wound packing for around a week. Continue ostomy care. I will see the patient back in 4 weeks to examine her wound. I will see her back around 2 months after that and then we'll discuss ostomy takedown. I advised her that she must be off narcotics, must be back to normal energy level, and that the recovery will be similar to the recovery from the surgery.   Gave 50 oxycodone script.       Maudry Diego, MD Surgical Oncology, General & Endocrine Surgery [facility]  Default, Provider, MD No ref. provider found

## 2013-01-30 ENCOUNTER — Telehealth (INDEPENDENT_AMBULATORY_CARE_PROVIDER_SITE_OTHER): Payer: Self-pay | Admitting: General Surgery

## 2013-01-30 NOTE — Telephone Encounter (Signed)
Pt called to ask for Vicodin 10mg  tabs.  She states that she is out of the oxycodone 5mg  tablets that Dr Donell Beers gave her last week.  I stated that we do not give out narcotics over the weekend.  She needs to call the office on Mon if she is still having issues.

## 2013-02-01 ENCOUNTER — Telehealth (INDEPENDENT_AMBULATORY_CARE_PROVIDER_SITE_OTHER): Payer: Self-pay

## 2013-02-01 NOTE — Telephone Encounter (Signed)
Pt requesting refill of Percocet.  Dr. Donell Beers authorized Percocet 5/325 #30.  Rx written by Dr. Abbey Chatters.  Pt called to p/u.

## 2013-02-01 NOTE — Telephone Encounter (Signed)
Pt called to follow up her request for pain med refill.  A message was sent to [facility] this morning and I informed the pt she will call her back as soon as she has an answer for her.

## 2013-02-04 ENCOUNTER — Telehealth (INDEPENDENT_AMBULATORY_CARE_PROVIDER_SITE_OTHER): Payer: Self-pay | Admitting: General Surgery

## 2013-02-04 NOTE — Telephone Encounter (Signed)
Pt called and is ready to wean down from her Percocet.  Advised will call in the standing order for Hydrocodone 5/ 325 mg, # 30, 1-2 po Q4-6H prn pain, no refill to [facility]:  [phone].  Also advised to augment narcotics with iburpofen Q6H.

## 2013-02-08 ENCOUNTER — Telehealth (INDEPENDENT_AMBULATORY_CARE_PROVIDER_SITE_OTHER): Payer: Self-pay | Admitting: *Deleted

## 2013-02-08 NOTE — Telephone Encounter (Signed)
Spoke to Dr. Donell Beers who approved Norco 5/325mg  1 tab every 12 hours as needed for pain #30 no refills.  [facility] MD states this will be the last prescription for narcotics that she will approve.  Prescription called into [facility] [phone].  Patient informed and states understanding

## 2013-02-08 NOTE — Telephone Encounter (Signed)
Patient called to request refill of Norco.  Explained that Dr. Donell Beers must approve the refill since this will be 2nd refill.  [facility] MD paged at this time.

## 2013-02-25 ENCOUNTER — Encounter (INDEPENDENT_AMBULATORY_CARE_PROVIDER_SITE_OTHER): Payer: Self-pay

## 2013-02-25 ENCOUNTER — Ambulatory Visit (INDEPENDENT_AMBULATORY_CARE_PROVIDER_SITE_OTHER): Payer: Self-pay | Admitting: General Surgery

## 2013-02-25 ENCOUNTER — Encounter (INDEPENDENT_AMBULATORY_CARE_PROVIDER_SITE_OTHER): Payer: Self-pay | Admitting: General Surgery

## 2013-02-25 VITALS — BP 136/72 | HR 80 | Temp 97.6°F | Resp 16 | Ht 70.0 in | Wt 212.0 lb

## 2013-02-25 DIAGNOSIS — Z720 Tobacco use: Secondary | ICD-10-CM

## 2013-02-25 DIAGNOSIS — F17200 Nicotine dependence, unspecified, uncomplicated: Secondary | ICD-10-CM

## 2013-02-25 DIAGNOSIS — K5732 Diverticulitis of large intestine without perforation or abscess without bleeding: Secondary | ICD-10-CM

## 2013-02-25 DIAGNOSIS — K5720 Diverticulitis of large intestine with perforation and abscess without bleeding: Secondary | ICD-10-CM

## 2013-02-25 NOTE — Patient Instructions (Signed)
Work on smoking cessation.  Follow up in 2 months.

## 2013-02-25 NOTE — Progress Notes (Signed)
HISTORY: Pt doing much better than at last visit.  She is off narcotics.  She is getting stronger, but still feeling a bit fatigued.  She is doing better with her ostomy appliance.  She is having minimal skin irritation.     EXAM: General:  Alert and oriented.   Incision:  Well healed.  Ostomy pink.  No evidence of hernia.      ASSESSMENT AND PLAN:   Tobacco use Advised to stop smoking.  Offered referral to smoking cessation counseling/support group.     Advised that surgical risks are higher in smokers.    Diverticulitis of large intestine with perforation Doing well clinically.   Follow up in 2 months  Will plan colostomy takedown at that time.         Maudry Diego, MD Surgical Oncology, General & Endocrine Surgery [facility]  Default, Provider, MD No ref. provider found

## 2013-02-25 NOTE — Assessment & Plan Note (Addendum)
Doing well clinically.   Follow up in 2 months  Will plan colostomy takedown at that time.

## 2013-02-25 NOTE — Assessment & Plan Note (Signed)
Advised to stop smoking.  Offered referral to smoking cessation counseling/support group.     Advised that surgical risks are higher in smokers.

## 2013-03-15 ENCOUNTER — Encounter (INDEPENDENT_AMBULATORY_CARE_PROVIDER_SITE_OTHER): Payer: Self-pay | Admitting: *Deleted

## 2013-05-04 ENCOUNTER — Encounter (INDEPENDENT_AMBULATORY_CARE_PROVIDER_SITE_OTHER): Payer: Self-pay | Admitting: General Surgery

## 2013-06-14 ENCOUNTER — Telehealth (INDEPENDENT_AMBULATORY_CARE_PROVIDER_SITE_OTHER): Payer: Self-pay | Admitting: General Surgery

## 2013-06-14 NOTE — Telephone Encounter (Signed)
LMOM for patient to see if she can come in today to see Dr Donell Beers ( 06-14-13)  and not on Friday apt . Dr Donell Beers wants patient to be here today ( 06-14-13) at 3:15

## 2013-06-18 ENCOUNTER — Encounter (INDEPENDENT_AMBULATORY_CARE_PROVIDER_SITE_OTHER): Payer: Self-pay | Admitting: General Surgery

## 2013-07-12 ENCOUNTER — Encounter (INDEPENDENT_AMBULATORY_CARE_PROVIDER_SITE_OTHER): Payer: Self-pay | Admitting: General Surgery

## 2013-08-02 ENCOUNTER — Ambulatory Visit (INDEPENDENT_AMBULATORY_CARE_PROVIDER_SITE_OTHER): Payer: Self-pay | Admitting: General Surgery

## 2013-08-02 ENCOUNTER — Encounter (INDEPENDENT_AMBULATORY_CARE_PROVIDER_SITE_OTHER): Payer: Self-pay | Admitting: General Surgery

## 2013-08-02 VITALS — BP 160/90 | HR 104 | Temp 98.6°F | Resp 15 | Ht 70.0 in | Wt 225.4 lb

## 2013-08-02 DIAGNOSIS — K5732 Diverticulitis of large intestine without perforation or abscess without bleeding: Secondary | ICD-10-CM

## 2013-08-02 DIAGNOSIS — K5720 Diverticulitis of large intestine with perforation and abscess without bleeding: Secondary | ICD-10-CM

## 2013-08-02 NOTE — Patient Instructions (Signed)
Need colonoscopy prior to ostomy takedown.  Once colonoscopy scheduled, will schedule takedown.  Please let us know date of colonoscopy as soon as you have it.   I will see you back just prior to surgery to discuss risks.

## 2013-08-03 NOTE — Assessment & Plan Note (Signed)
Pt desires ostomy takedown.    She is recommended to get colonoscopy prior to takedown.  She had no prior complaints when she presented with perforated diverticulitis.    She is much stronger now.  She has also cut back on her tobacco intake.    I briefly reviewed the surgery.  She inquires about laparoscopic surgery, but I advised her this would likely not be possible since she had gross purulent drainage in her abdomen at the time of surgery.  She will likely have too many adhesions to perform the takedown laparoscopically, but I am willing to look in with camera.    I will need to see her back for pre op after colonoscopy.

## 2013-08-03 NOTE — Progress Notes (Signed)
HISTORY: Pt is a 47 yo F who is s/p Hartmann's procedure 12/20/2012 for perforated diverticulitis.  She is doing much better from an energy standpoint.  She has currently lost her insurance as she was unable to procure FMLA due to being at her job less than 1 year.  She has decreased her tobacco intake, but has not quit all the way yet.  She denies abdominal pain, nausea, or vomiting. She denies fever/ chills.     PERTINENT REVIEW OF SYSTEMS: Otherwise negative x 11  Filed Vitals:   08/02/13 1443  BP: 160/90  Pulse: 104  Temp: 98.6 F (37 C)  Resp: 15   Filed Weights   08/02/13 1443  Weight: 225 lb 6.4 oz (102.241 kg)    EXAM: Head: Normocephalic and atraumatic.  Eyes:  Conjunctivae are normal. Pupils are equal, round, and reactive to light. No scleral icterus.  Resp: No respiratory distress, normal effort. Abd:  Abdomen is soft, non distended and non tender. No masses are palpable.  There is no rebound and no guarding. 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  Some keloid at scar.   Neurological: Alert and oriented to person, place, and time. Coordination normal.  Skin: Skin is warm and dry. No rash noted. No diaphoretic. No erythema. No pallor.  Psychiatric: Normal mood and affect. Normal behavior. Judgment and thought content normal.      ASSESSMENT AND PLAN:   Diverticulitis of large intestine with perforation Pt desires ostomy takedown.    She is recommended to get colonoscopy prior to takedown.  She had no prior complaints when she presented with perforated diverticulitis.    She is much stronger now.  She has also cut back on her tobacco intake.    I briefly reviewed the surgery.  She inquires about laparoscopic surgery, but I advised her this would likely not be possible since she had gross purulent drainage in her abdomen at the time of surgery.  She will likely have too many adhesions to perform the takedown laparoscopically, but I am willing to look in with camera.     I will need to see her back for pre op after colonoscopy.        Maudry Diego, MD Surgical Oncology, General & Endocrine Surgery [facility]  Default, Provider, MD No ref. provider found

## 2013-08-04 ENCOUNTER — Telehealth (INDEPENDENT_AMBULATORY_CARE_PROVIDER_SITE_OTHER): Payer: Self-pay

## 2013-08-04 ENCOUNTER — Encounter: Payer: Self-pay | Admitting: Internal Medicine

## 2013-08-04 NOTE — Telephone Encounter (Signed)
Telephone call to patient.  Patient needs to be informed of gastro appointments.  09/29/13 at 1:30 is pre-op appointment, colonoscopy will be on 10/13/13 at 10:00am.  [facility].  Please inform patient the cost for the procedure is $1800.00 to $3000.00 depending on what they find in the colonoscopy.

## 2013-08-04 NOTE — Telephone Encounter (Signed)
Patient called back and was given below message.  Patient is agreeable with plan at this time.

## 2013-09-29 ENCOUNTER — Ambulatory Visit (AMBULATORY_SURGERY_CENTER): Payer: Self-pay | Admitting: *Deleted

## 2013-09-29 VITALS — Ht 70.0 in | Wt 226.6 lb

## 2013-09-29 DIAGNOSIS — K5732 Diverticulitis of large intestine without perforation or abscess without bleeding: Secondary | ICD-10-CM

## 2013-09-29 MED ORDER — NA SULFATE-K SULFATE-MG SULF 17.5-3.13-1.6 GM/177ML PO SOLN: ORAL | Status: DC

## 2013-09-29 NOTE — Progress Notes (Signed)
Sent patient's chart via flag to Dr.Gessner to review before direct colonoscopy.

## 2013-09-29 NOTE — Progress Notes (Signed)
Patient denies any allergies to eggs or soy. Patient denies any problems with anesthesia.  

## 2013-10-01 ENCOUNTER — Telehealth: Payer: Self-pay | Admitting: *Deleted

## 2013-10-01 NOTE — Telephone Encounter (Signed)
Should be fine for direct Have her do an enema into rectum in addition to oral prep. Thanks CEG ----- Message ----- From: Lucia Gaskins, RN Sent: 09/29/2013 3:48 PM   To: Iva Boop, MD Please review patient's history, and surgery hx. Just wanted to make you aware before patient comes in for DIRECT colonoscopy. Also gave patient suprep sample for prep. Thanks, Robbin PV

## 2013-10-01 NOTE — Telephone Encounter (Signed)
Called pt to give updated prep instructions.  No answer.  Will call pt back

## 2013-10-04 NOTE — Telephone Encounter (Signed)
Pt instructed to use 1 fleet enema rectally about 1/12 hours before coming for procedure

## 2013-10-04 NOTE — Telephone Encounter (Signed)
Left message for pt to call back  °

## 2013-10-13 ENCOUNTER — Encounter: Payer: Self-pay | Admitting: Internal Medicine

## 2013-10-13 ENCOUNTER — Ambulatory Visit (AMBULATORY_SURGERY_CENTER): Payer: Self-pay | Admitting: Internal Medicine

## 2013-10-13 VITALS — BP 111/57 | HR 71 | Temp 98.5°F | Resp 13 | Ht 70.0 in | Wt 226.0 lb

## 2013-10-13 DIAGNOSIS — K5732 Diverticulitis of large intestine without perforation or abscess without bleeding: Secondary | ICD-10-CM

## 2013-10-13 DIAGNOSIS — Z01818 Encounter for other preprocedural examination: Secondary | ICD-10-CM

## 2013-10-13 MED ORDER — SODIUM CHLORIDE 0.9 % IV SOLN: 500.0000 mL | INTRAVENOUS | Status: DC

## 2013-10-13 NOTE — Patient Instructions (Addendum)
The colonoscopy exam was normal.  Next routine colonoscopy in 10 years - 2024.  It is the time of year to have a vaccination to prevent the flu (influenza virus). If you have not already done so, please have this done through your primary care provider or you can get this done at local pharmacies or the [facility]. It would be very helpful if you notify your primary care provider when and where you had the vaccination given by messaging them in My Chart, leaving a message or faxing the information.  Good luck with your upcoming surgery!  I appreciate the opportunity to care for you. Iva Boop, MD, FACG      YOU HAD AN ENDOSCOPIC PROCEDURE TODAY AT THE [facility]: Refer to the procedure report that was given to you for any specific questions about what was found during the examination.  If the procedure report does not answer your questions, please call your gastroenterologist to clarify.  If you requested that your care partner not be given the details of your procedure findings, then the procedure report has been included in a sealed envelope for you to review at your convenience later.  YOU SHOULD EXPECT: Some feelings of bloating in the abdomen. Passage of more gas than usual.  Walking can help get rid of the air that was put into your GI tract during the procedure and reduce the bloating. If you had a lower endoscopy (such as a colonoscopy or flexible sigmoidoscopy) you may notice spotting of blood in your stool or on the toilet paper. If you underwent a bowel prep for your procedure, then you may not have a normal bowel movement for a few days.  DIET: Your first meal following the procedure should be a light meal and then it is ok to progress to your normal diet.  A half-sandwich or bowl of soup is an example of a good first meal.  Heavy or fried foods are harder to digest and may make you feel nauseous or bloated.  Likewise meals heavy in dairy and vegetables can  cause extra gas to form and this can also increase the bloating.  Drink plenty of fluids but you should avoid alcoholic beverages for 24 hours.  ACTIVITY: Your care partner should take you home directly after the procedure.  You should plan to take it easy, moving slowly for the rest of the day.  You can resume normal activity the day after the procedure however you should NOT DRIVE or use heavy machinery for 24 hours (because of the sedation medicines used during the test).    SYMPTOMS TO REPORT IMMEDIATELY: A gastroenterologist can be reached at any hour.  During normal business hours, 8:30 AM to 5:00 PM Monday through Friday, call [phone].  After hours and on weekends, please call the GI answering service at [phone] who will take a message and have the physician on call contact you.   Following lower endoscopy (colonoscopy or flexible sigmoidoscopy):  Excessive amounts of blood in the stool  Significant tenderness or worsening of abdominal pains  Swelling of the abdomen that is new, acute  Fever of 100F or higher   FOLLOW UP: If any biopsies were taken you will be contacted by phone or by letter within the next 1-3 weeks.  Call your gastroenterologist if you have not heard about the biopsies in 3 weeks.  Our staff will call the home number listed on your records the next business day following your  procedure to check on you and address any questions or concerns that you may have at that time regarding the information given to you following your procedure. This is a courtesy call and so if there is no answer at the home number and we have not heard from you through the emergency physician on call, we will assume that you have returned to your regular daily activities without incident.  SIGNATURES/CONFIDENTIALITY: You and/or your care partner have signed paperwork which will be entered into your electronic medical record.  These signatures attest to the fact that that the  information above on your After Visit Summary has been reviewed and is understood.  Full responsibility of the confidentiality of this discharge information lies with you and/or your care-partner.   Resume medications.

## 2013-10-13 NOTE — Op Note (Signed)
[facility] [address] [place], [address]   COLONOSCOPY PROCEDURE REPORT  PATIENT: Joanne Mora, Joanne Mora  MR#: [id number] BIRTHDATE: 08/04/1966 , 46  yrs. old GENDER: Female ENDOSCOPIST: Iva Boop, MD, [facility] REFERRED XB:JYNWG Donell Beers, M.D. PROCEDURE DATE:  10/13/2013 PROCEDURE:   Colonoscopy, diagnostic First Screening Colonoscopy - Avg.  risk and is 50 yrs.  old or older - No.  Prior Negative Screening - Now for repeat screening. N/A  History of Adenoma - Now for follow-up colonoscopy & has been > or = to 3 yrs.  N/A  Polyps Removed Today? No.  Recommend repeat exam, <10 yrs? No. ASA CLASS:   Class II INDICATIONS:pre-operative exam before colostomy takedown after diverticulitis surgery. MEDICATIONS: propofol (Diprivan) 300mg  IV, MAC sedation, administered by CRNA, and These medications were titrated to patient response per physician's verbal order  DESCRIPTION OF PROCEDURE:   After the risks benefits and alternatives of the procedure were thoroughly explained, informed consent was obtained.  A digital rectal exam revealed no abnormalities of the rectum.   The LB PFC-H190 [id number]  endoscope was introduced through the descending colostomy and advanced to the cecum, which was identified by both the appendix and ileocecal valve. No adverse events experienced.   The quality of the prep was excellent using Suprep  The instrument was then slowly withdrawn as the colon was fully examined.  COLON FINDINGS: A normal appearing cecum, ileocecal valve, and appendiceal orifice were identified.  The ascending, hepatic flexure, transverse, splenic flexure and descending, sigmoid colon and rectum appeared unremarkable. Note that the patient is s/p Hartmann's procedure for perforated diverticulosis and after inspecting via colostomy the scope was inserted through the anus to inspect the rectum and sigmoid colon. No polyps or cancers were seen.  Retroflexed views revealed  no abnormalities. The time to cecum=1 minutes 40 seconds.  Withdrawal time=6 minutes 26 seconds. The scope was withdrawn and the procedure completed. COMPLICATIONS: There were no complications.  ENDOSCOPIC IMPRESSION: Normal colon and rectum s/p Hartmann's procedure with existing colostomy. Excellent prep.  RECOMMENDATIONS: 1.  Repeat colonoscopy 10 years - 2024 2.   No contra-indication to colostomy takedown seen.   eSigned:  Iva Boop, MD, [facility] 10/13/2013 11:55 AM   cc: [name], MD and The Patient

## 2013-10-13 NOTE — Progress Notes (Signed)
Patient did not experience any of the following events: a burn prior to discharge; a fall within the facility; wrong site/side/patient/procedure/implant event; or a hospital transfer or hospital admission upon discharge from the facility. (G8907) Patient did not have preoperative order for IV antibiotic SSI prophylaxis. (G8918)  

## 2013-10-13 NOTE — Progress Notes (Signed)
Procedure ends, to recovery, report given and VSS. 

## 2013-10-14 ENCOUNTER — Telehealth: Payer: Self-pay | Admitting: *Deleted

## 2013-10-14 NOTE — Telephone Encounter (Signed)
  Follow up Call-  Call back number 10/13/2013  Post procedure Call Back phone  # [phone]  Permission to leave phone message Yes     No answer,left message.

## 2013-10-15 ENCOUNTER — Telehealth (INDEPENDENT_AMBULATORY_CARE_PROVIDER_SITE_OTHER): Payer: Self-pay

## 2013-10-15 NOTE — Telephone Encounter (Signed)
Pt called wanting Dr Donell Beers to view pts colonoscopy that was recently done. I advised pt msg will be sent to Dr Donell Beers and her assistant to review result.

## 2013-10-17 ENCOUNTER — Other Ambulatory Visit (INDEPENDENT_AMBULATORY_CARE_PROVIDER_SITE_OTHER): Payer: Self-pay | Admitting: General Surgery

## 2014-05-19 ENCOUNTER — Encounter (HOSPITAL_COMMUNITY): Payer: Self-pay | Admitting: Emergency Medicine

## 2014-05-19 ENCOUNTER — Emergency Department (HOSPITAL_COMMUNITY)
Admission: EM | Admit: 2014-05-19 | Discharge: 2014-05-19 | Disposition: A | Discharge status: Home / Self Care | Payer: Self-pay | Attending: Emergency Medicine | Admitting: Emergency Medicine

## 2014-05-19 ENCOUNTER — Emergency Department (HOSPITAL_COMMUNITY): Payer: Self-pay

## 2014-05-19 DIAGNOSIS — S93609A Unspecified sprain of unspecified foot, initial encounter: Secondary | ICD-10-CM | POA: Insufficient documentation

## 2014-05-19 DIAGNOSIS — X500XXA Overexertion from strenuous movement or load, initial encounter: Secondary | ICD-10-CM | POA: Insufficient documentation

## 2014-05-19 DIAGNOSIS — Z87448 Personal history of other diseases of urinary system: Secondary | ICD-10-CM | POA: Insufficient documentation

## 2014-05-19 DIAGNOSIS — F17200 Nicotine dependence, unspecified, uncomplicated: Secondary | ICD-10-CM | POA: Insufficient documentation

## 2014-05-19 DIAGNOSIS — S93602A Unspecified sprain of left foot, initial encounter: Secondary | ICD-10-CM

## 2014-05-19 DIAGNOSIS — Y9389 Activity, other specified: Secondary | ICD-10-CM | POA: Insufficient documentation

## 2014-05-19 DIAGNOSIS — Z8719 Personal history of other diseases of the digestive system: Secondary | ICD-10-CM | POA: Insufficient documentation

## 2014-05-19 DIAGNOSIS — Y9289 Other specified places as the place of occurrence of the external cause: Secondary | ICD-10-CM | POA: Insufficient documentation

## 2014-05-19 DIAGNOSIS — Z6831 Body mass index (BMI) 31.0-31.9, adult: Secondary | ICD-10-CM | POA: Insufficient documentation

## 2014-05-19 DIAGNOSIS — IMO0001 Reserved for inherently not codable concepts without codable children: Secondary | ICD-10-CM | POA: Insufficient documentation

## 2014-05-19 MED ORDER — ACETAMINOPHEN 325 MG PO TABS: 325.0000 mg | ORAL_TABLET | Freq: Four times a day (QID) | ORAL | Status: AC | PRN

## 2014-05-19 MED ORDER — OXYCODONE-ACETAMINOPHEN 5-325 MG PO TABS
1.0000 | ORAL_TABLET | Freq: Once | ORAL | Status: AC
  Administered 2014-05-19: 1 via ORAL
  Filled 2014-05-19: qty 1

## 2014-05-19 NOTE — ED Notes (Signed)
Pt was getting off the bus and felt something pop in her left foot. Hurts to bear weight.

## 2014-05-19 NOTE — ED Provider Notes (Signed)
CSN: [id number]     Arrival date & time 05/19/14  1750 History  This chart was scribed for non-physician practitioner Raymon MuttonMarissa Sciacca, PA-C working with Gwyneth SproutWhitney Plunkett, MD by Joaquin MusicKristina Sanchez-Matthews, ED Scribe. This patient was seen in room TR08C/TR08C and the patient's care was started at 8:51 PM .   Chief Complaint  Patient presents with  . Foot Injury   The history is provided by the patient. No language interpreter was used.   HPI Comments: Joanne Mora is a 48 y.o. female who presents to the Emergency Department complaining of L foot injury that occurred today at noon. Pt states she was on the bus, made a sudden turn and heard a snap; states she immediately had ongoing throbbing pulsating pain to the dorsal aspect of L foot with swelling. Hx of fracture to L ankle when she was 48 years old. Denies recent fever, chills, numbness, tingling, loss of sensation, and taking OTC medication PTA.  Past Medical History  Diagnosis Date  . Diverticulitis of large intestine with perforation 12/28/2012    Sigmoid colon.  . Ileus following gastrointestinal surgery 12/28/2012  . Tobacco use 12/28/2012  . BMI 31.0-31.9,adult 12/28/2012  . Kidney disease    Past Surgical History  Procedure Laterality Date  . Laparotomy  12/20/2012    Procedure: EXPLORATORY LAPAROTOMY;  Surgeon: Almond LintFaera Byerly, MD;  [facility];  Service: General;  Laterality: N/A;  . Colostomy revision  12/20/2012    Procedure: COLON RESECTION SIGMOID;  Surgeon: Almond LintFaera Byerly, MD;  Location: [facility] OR;  Service: General;  Laterality: N/A;  . Colostomy  12/20/2012    Procedure: COLOSTOMY;  Surgeon: Almond LintFaera Byerly, MD;  Location: [facility] OR;  Service: General;  Laterality: N/A;  . Tubal ligation     Family History  Problem Relation Age of Onset  . Hypertension Father   . Diabetes Mother   . Hypertension Mother   . Heart failure Mother   . Colon cancer Neg Hx    History  Substance Use Topics  . Smoking status: Current Every Day Smoker --  1.00 packs/day for 25 years    Types: Cigarettes  . Smokeless tobacco: Never Used  . Alcohol Use: Yes     Comment: socially   OB History   Grav Para Term Preterm Abortions TAB SAB Ect Mult Living                 Review of Systems  Constitutional: Negative for fever and chills.  Musculoskeletal: Positive for myalgias.       Foot pain  Skin: Negative for color change and wound.  Neurological: Negative for weakness and numbness.   Allergies  Review of patient's allergies indicates no known allergies.  Home Medications   Prior to Admission medications   Not on File   BP 134/79  Pulse 83  Temp(Src) 98 F (36.7 C) (Oral)  Resp 18  Ht 5\' 10"  (1.778 m)  Wt 200 lb (90.719 kg)  BMI 28.70 kg/m2  SpO2 97%  LMP 05/05/2014  Physical Exam  Nursing note and vitals reviewed. Constitutional: She is oriented to person, place, and time. She appears well-developed and well-nourished. No distress.  HENT:  Head: Normocephalic and atraumatic.  Mouth/Throat: Oropharynx is clear and moist. No oropharyngeal exudate.  Eyes: Conjunctivae and EOM are normal. Pupils are equal, round, and reactive to light. Right eye exhibits no discharge. Left eye exhibits no discharge.  Neck: Normal range of motion. Neck supple. No tracheal deviation present.  Cardiovascular: Normal  rate, regular rhythm and normal heart sounds.  Exam reveals no friction rub.   No murmur heard. Pulses:      Radial pulses are 2+ on the right side, and 2+ on the left side.       Dorsalis pedis pulses are 2+ on the right side, and 2+ on the left side.  Pulmonary/Chest: Effort normal and breath sounds normal. No respiratory distress. She has no wheezes. She has no rales.  Musculoskeletal: Normal range of motion. She exhibits tenderness.       Left ankle: She exhibits normal range of motion, no swelling, no ecchymosis, no deformity and no laceration. Tenderness (Dorsal).       Feet:  Negative swelling, erythema, inflammation,  lesions, sores, deformities identified to the left foot. Discomfort upon palpation to the dorsal aspect of the foot. Negative warmth upon palpation. Full range of motion identified to the digits of the left foot without difficulty. Full range of motion to the left ankle without difficulty-negative abnormalities or deformities identified to left ankle with full range of motion.  Lymphadenopathy:    She has no cervical adenopathy.  Neurological: She is alert and oriented to person, place, and time. No cranial nerve deficit. She exhibits normal muscle tone. Coordination normal.  Cranial nerves III-XII grossly intact Strength 5+/5+ to lower extremities bilaterally with resistance applied, equal distribution noted Strength intact to digits of the left foot Sensation intact with differentiation to sharp and dull touch Negative arm drift Fine motor skills intact Heel to knee down shin normal bilaterally Gait proper, proper balance - negative sway, negative drift, negative step-offs  Skin: Skin is warm and dry. No rash noted. She is not diaphoretic. No erythema.  Psychiatric: She has a normal mood and affect. Her behavior is normal. Thought content normal.    ED Course  Procedures (including critical care time) DIAGNOSTIC STUDIES: Oxygen Saturation is 97% on RA, normal by my interpretation.    COORDINATION OF CARE: 8:55 PM-Discussed treatment plan which includes discussed X-ray and advised pt to rest. Will administer Percocet while in the ED. Will discharge pt with crutches and cam-walker. Pt requesting a note due moving into [employer] [facility]. Pt agreed to plan.   Labs Review Labs Reviewed - No data to display  Imaging Review Dg Foot Complete Left  05/19/2014   CLINICAL DATA:  Left foot pain along the metatarsals. Twisting injury. Remote fracture.  EXAM: LEFT FOOT - COMPLETE 3+ VIEW  COMPARISON:  None.  FINDINGS: Multipartite accessory navicular without obvious findings of acute cortical  discontinuity.  No malalignment at the Lisfranc joint. Plantar calcaneal spur noted. Soft tissue swelling noted along the dorsum of the foot overlying the metatarsals.  IMPRESSION: 1. Soft tissue swelling along the dorsum of the foot. 2. Multipartite accessory navicular, likely chronic. 3. No acute fracture identified.   Electronically Signed   By: Herbie BaltimoreWalt  Liebkemann M.D.   On: 05/19/2014 20:39     EKG Interpretation None      MDM   Final diagnoses:  Foot sprain, left, initial encounter    Medications  oxyCODONE-acetaminophen (PERCOCET/ROXICET) 5-325 MG per tablet 1 tablet (not administered)  oxyCODONE-acetaminophen (PERCOCET/ROXICET) 5-325 MG per tablet 1 tablet (1 tablet Oral Given 05/19/14 1816)    I personally performed the services described in this documentation, which was scribed in my presence. The recorded information has been reviewed and is accurate.  Plain film of left foot noted soft tissue swelling along the dorsum of the foot with no acute  fracture identified. Negative focal neurological deficits noted. Pulses palpable and strong - DP and PT strong bilaterally. Full range of motion noted to the digits. Sensation intact. Imaging unremarkable. Patient placed in cam walker boot and crutches administered. Doubt ischemia. Doubt compartment syndrome. Suspicion to be ligamental sprain. Patient stable, afebrile. Patient not septic appearing. Discharged patient. Discharged patient with Tylenol. Patient given work note for rest regarding shelter hours. Discussed with patient to rest, ice, elevate. Referred patient to orthopedics. Discussed with patient to closely monitor symptoms and if symptoms are to worsen or change to report back to the ED - strict return instructions given.  Patient agreed to plan of care, understood, all questions answered.   Raymon Mutton, PA-C 05/20/14 [id number]

## 2014-05-19 NOTE — ED Notes (Signed)
Ice pack given to patient.

## 2014-05-19 NOTE — Discharge Instructions (Signed)
Please call and set up an appointment with orthopedics Please keep foot in boot Please use crutches Please take medications as prescribed to no more than 4000 mg/4 g per day Please elevate-keep toes above nose Please continue to monitor symptoms closely if symptoms are to worsen or change (fever greater than 101, chills, chest pain, shortness of breath, difficulty breathing, numbness, tingling, changes to skin colored, worsening or changes to pain symptoms, fall, injury, loss of sensation) please report back to the ED immediately   Foot Sprain The muscles and cord like structures which attach muscle to bone (tendons) that surround the feet are made up of units. A foot sprain can occur at the weakest spot in any of these units. This condition is most often caused by injury to or overuse of the foot, as from playing contact sports, or aggravating a previous injury, or from poor conditioning, or obesity. SYMPTOMS  Pain with movement of the foot.  Tenderness and swelling at the injury site.  Loss of strength is present in moderate or severe sprains. THE THREE GRADES OR SEVERITY OF FOOT SPRAIN ARE:  Mild (Grade I): Slightly pulled muscle without tearing of muscle or tendon fibers or loss of strength.  Moderate (Grade II): Tearing of fibers in a muscle, tendon, or at the attachment to bone, with small decrease in strength.  Severe (Grade III): Rupture of the muscle-tendon-bone attachment, with separation of fibers. Severe sprain requires surgical repair. Often repeating (chronic) sprains are caused by overuse. Sudden (acute) sprains are caused by direct injury or over-use. DIAGNOSIS  Diagnosis of this condition is usually by your own observation. If problems continue, a caregiver may be required for further evaluation and treatment. X-rays may be required to make sure there are not breaks in the bones (fractures) present. Continued problems may require physical therapy for  treatment. PREVENTION  Use strength and conditioning exercises appropriate for your sport.  Warm up properly prior to working out.  Use athletic shoes that are made for the sport you are participating in.  Allow adequate time for healing. Early return to activities makes repeat injury more likely, and can lead to an unstable arthritic foot that can result in prolonged disability. Mild sprains generally heal in 3 to 10 days, with moderate and severe sprains taking 2 to 10 weeks. Your caregiver can help you determine the proper time required for healing. HOME CARE INSTRUCTIONS   Apply ice to the injury for 15-20 minutes, 03-04 times per day. Put the ice in a plastic bag and place a towel between the bag of ice and your skin.  An elastic wrap (like an Ace bandage) may be used to keep swelling down.  Keep foot above the level of the heart, or at least raised on a footstool, when swelling and pain are present.  Try to avoid use other than gentle range of motion while the foot is painful. Do not resume use until instructed by your caregiver. Then begin use gradually, not increasing use to the point of pain. If pain does develop, decrease use and continue the above measures, gradually increasing activities that do not cause discomfort, until you gradually achieve normal use.  Use crutches if and as instructed, and for the length of time instructed.  Keep injured foot and ankle wrapped between treatments.  Massage foot and ankle for comfort and to keep swelling down. Massage from the toes up towards the knee.  Only take over-the-counter or prescription medicines for pain, discomfort, or fever as  directed by your caregiver. SEEK IMMEDIATE MEDICAL CARE IF:   Your pain and swelling increase, or pain is not controlled with medications.  You have loss of feeling in your foot or your foot turns cold or blue.  You develop new, unexplained symptoms, or an increase of the symptoms that brought you to  your caregiver. MAKE SURE YOU:   Understand these instructions.  Will watch your condition.  Will get help right away if you are not doing well or get worse. Document Released: 05/03/2002 Document Revised: 02/03/2012 Document Reviewed: 06/30/2008 [facility] Patient Information 2015 [place], [place]. This information is not intended to replace advice given to you by your health care provider. Make sure you discuss any questions you have with your health care provider.   Emergency Department Resource Guide 1) Find a Doctor and Pay Out of Pocket Although you won't have to find out who is covered by your insurance plan, it is a good idea to ask around and get recommendations. You will then need to call the office and see if the doctor you have chosen will accept you as a new patient and what types of options they offer for patients who are self-pay. Some doctors offer discounts or will set up payment plans for their patients who do not have insurance, but you will need to ask so you aren't surprised when you get to your appointment.  2) Contact Your Local Health Department Not all health departments have doctors that can see patients for sick visits, but many do, so it is worth a call to see if yours does. If you don't know where your local health department is, you can check in your phone book. The CDC also has a tool to help you locate your state's health department, and many state websites also have listings of all of their local health departments.  3) Find a Walk-in Clinic If your illness is not likely to be very severe or complicated, you may want to try a walk in clinic. These are popping up all over the country in pharmacies, drugstores, and shopping centers. They're usually staffed by nurse practitioners or physician assistants that have been trained to treat common illnesses and complaints. They're usually fairly quick and inexpensive. However, if you have serious medical issues or chronic  medical problems, these are probably not your best option.  No Primary Care Doctor: - Call Health Connect at  [phone] - they can help you locate a primary care doctor that  accepts your insurance, provides certain services, etc. - Physician Referral Service- [phone]  Chronic Pain Problems: Organization         Address  Phone   Notes  [facility]  [phone]) [phone] Patients need to be referred by their primary care doctor.   Medication Assistance: Organization         Address  Phone   Notes  [facility] Medication [facility] [address] [place], [place] [address] [phone]) [phone] --Must be a resident of [facility] [place] -- Must have NO insurance coverage whatsoever (no Medicaid/ Medicare, etc.) -- The pt. MUST have a primary care doctor that directs their care regularly and follows them in the community   MedAssist  [phone]   [employer]  [phone]) [phone]    Agencies that provide inexpensive medical care: Organization         Address  Phone   Notes  [facility]  [phone](336) [phone]   [facility]    [phone]) [phone]   [facility] [address] [place], [place] [address] [phone]   [facility] [place] [address], [place] [phone]) [phone]   Planned Parenthood    [phone]) [phone]   [facility]    [phone]   [facility]  [address], [place] Phone:  [phone], Fax:  [phone](336) [phone] Hours of Operation:  9 am - 6 pm, M-F.  Also accepts Medicaid/Medicare and self-pay.  [facility]  [address], [place] Phone: [phone], Fax: [phone]. Hours of Operation:  8:30 am - 5:30 pm, M-F.  Also accepts Medicaid and self-pay.  [facility] [place] [address], [place] Phone: [phone]) [phone]   [facility] [address], [place], [place] [phone],  Ext. [phone] Mondays & Thursdays: 7-9 AM.  First 15 patients are seen on a first come, first serve basis.    Medicaid-accepting [facility] Providers:  Organization         Address  Phone   Notes  [facility] [address], [place] [phone] Also accepts self-pay patients.  [facility] [address], [place]  [phone]) [phone]   [facility] [address], [place] [phone]) [phone]   [facility] [address] [place] [address], [place] [phone]) [phone]   Renaye RakersVeita Bland [address], [place]   [phone] Only accepts [place] Access [place] patients after they have their name applied to their card.   Self-Pay (no insurance) in [facility]:  Organization         Address  Phone   Notes  Sickle Cell Patients, [facility] [address], [place] [phone]) [phone]   [facility] [address], [place] [phone]) [phone]   [facility] [place]  [address], [place] [phone]   [facility]/Dr. Osei-Bonsu  [address] [place] [address], [place] or [id number], [place] [phone] Phone number for both [place] and [place] locations is the same.  [facility] [address], [place] [phone]   [facility]  [address] [place] [address], [place] or [address] [phone]   [facility] [address], [place] [phone], phone; [phone]) [phone], fax Sees patients 1st and 3rd Saturday of every month.  Must not qualify for public or private insurance (i.e. Medicaid, Medicare, [place] Health Choice, Veterans' Benefits)  Household income should be no more than 200% of the poverty level The clinic cannot treat you if you are pregnant or think you are pregnant  Sexually transmitted diseases are not  treated at the clinic.    Dental Care: Organization         Address  Phone  Notes  [facility] [address], [place] [phone] Accepts children up to age 46 who are enrolled in [place]icaid or [place] Health Choice; pregnant women with a Medicaid card; and children who have applied for Medicaid or  Health Choice, but were declined, whose parents can pay a reduced fee at time of service.  [facility] Department of [facility] [place]  [address], [place] (  [phone] Accepts children up to age 4 who are enrolled in Medicaid or [place] Health Choice; pregnant women with a Medicaid card; and children who have applied for Medicaid or [place] Health Choice, but were declined, whose parents can pay a reduced fee at time of service.  [facility]  [address] [place], [place] [phone] Patients are seen by appointment only. Walk-ins are not accepted. [facility] will see patients 30 years of age and older. Monday - Tuesday (8am-5pm) Most Wednesdays (8:30-5pm) $30 per visit, cash only  [facility] Access [facility]  [address], [place] [facility] [phone] Patients are seen by appointment only. Walk-ins are not accepted. [facility] will see patients 75 years of age and older. One Wednesday Evening (Monthly: Volunteer Based).  $30 per visit, cash only  [employer]  [phone] for adults; Children under age 57, call Graduate Pediatric Dentistry at [phone]. Children aged 47-14, please call [phone] to request a pediatric application.  Dental services are provided in all areas of dental care including fillings, crowns and bridges, complete and partial dentures, implants, gum treatment, root canals, and extractions. Preventive care is also provided. Treatment is provided to both adults and children. Patients are selected via a lottery and there is  often a waiting list.   [facility] [address], [place]  [phone] [email or web address]   [facility] [address] [place], [place] [phone], Ext. [phone] Second and Fourth Thursday of each month, opens at 6:30 AM; Clinic ends at 9 AM.  Patients are seen on a first-come first-served basis, and a limited number are seen during each clinic.   [facility]  [address] Ether Griffins [place], [place] [phone]   Eligibility Requirements You must have lived in [place], [place], or [place] counties for at least the last three months.   You cannot be eligible for state or federal sponsored [place], including [employer], [place], or [employer].   You generally cannot be eligible for healthcare insurance through your employer.    How to apply: Eligibility screenings are held every Tuesday and Wednesday afternoon from 1:00 pm until 4:00 pm. You do not need an appointment for the interview!  [facility] [address] [place], [place] [phone]   [facility]  [phone]   [facility] Department  [phone]   [facility] Health Department  [phone]    Behavioral Health Resources in the Community: Intensive Outpatient Programs Organization         Address  Phone  Notes  [facility] [address], [place], [place] [phone]   [facility]patient [address], [place], [place] [phone]   ADS: Alcohol [facility] Drug S[facility]cs [address], [place], [place]  [phone]   [facility] [address],  [place], [place] [phone] or [phone]   Substance Abuse Resources Organization         Address  Phone  Notes  [facility] Drug Services  [phone]   [facility]  [phone]   [facility] [place]  [phone]   Floydene Flock  [phone]   Residential & Outpatient Substance  Abuse Program  [phone]   Psychological Services Organization         Address  Phone  Notes  [facility]  [phone]   [facility]  [phone]  [place] [address], [place] or [phone]    Mobile Crisis Teams Organization         Address  Phone  Notes  [facility], Mobile Crisis Care Unit  [phone]   [facility]  [address] [place], [place]   [place] Levels [address], [place] [phone]    Self-Help/Support Groups Organization         Address  Phone             Notes  [place]. of [place] - variety of support groups  [place] Call for more information  Narcotics Anonymous (NA), [facility] [address], [employer] [place]  2 meetings at this location   Special educational needs teacher         Address  Phone  Notes  [facility]idential Treatment [place],    [place]  [phone]   [facility]  [address] [place] [id number], [place], [place]   [place], [place] [phone] Admissions: 8am-3pm M-F  Incentives Substance [place] [address]    [place], [place] [phone]   [facility] [address] [place], [place], [place]   [facility] [address]  [place], [place]   [facility]ight Programs - Intensive Outpatient [address], [place], [place]   [facility] ([place].) [address].,  [place], [place] [phone] or [phone]   [facility] (RTS) [address] [employer], [place] Accepts Medicaid  [facility] [place] [address],  [place] [phone] Substance Abuse/Addiction Treatment   [facility] Organization          Address  Phone  Notes  [facility]  [phone]   Domenic Schwab, PhD [address] [place], [place]   [phone] or [phone]   [place], [place] [phone]   [facility] [address], [place], [place] [phone] Insurance/Medicaid/sponsorship through [facility] and Families [address] [place]                                    [place], [place] [phone] [place] [address] [place], [place] [phone]    Dr. Adele Schilder  [phone]   [facility] Dept. 1) [address] [place] 2) [place] 3)  [place] [address], [place] [phone]  [phone]   [place] [phone] or [phone] (After Hours)

## 2014-05-19 NOTE — ED Notes (Signed)
Pt instructed on crutch and cam walker usage. Teach back done.

## 2014-05-19 NOTE — ED Notes (Signed)
Pt states she was moving some bags and heard a snap in her left foot. Pt has slight swelling, and decreased ability to bear weight on leg. Pt rates pain 8/10. Pt tried ice but no otc pain meds.

## 2014-05-20 NOTE — ED Provider Notes (Signed)
Medical screening examination/treatment/procedure(s) were performed by non-physician practitioner and as supervising physician I was immediately available for consultation/collaboration.   EKG Interpretation None        Whitney Plunkett, MD 05/20/14 1414 

## 2015-11-09 IMAGING — CR DG FOOT COMPLETE 3+V*L*
4 series · 4 of 4 positions shown · non-contrast
Comparison: None.

CLINICAL DATA: Left foot pain along the metatarsals. Twisting
injury. Remote fracture.

EXAM:
LEFT FOOT - COMPLETE 3+ VIEW

[x foot ap left]
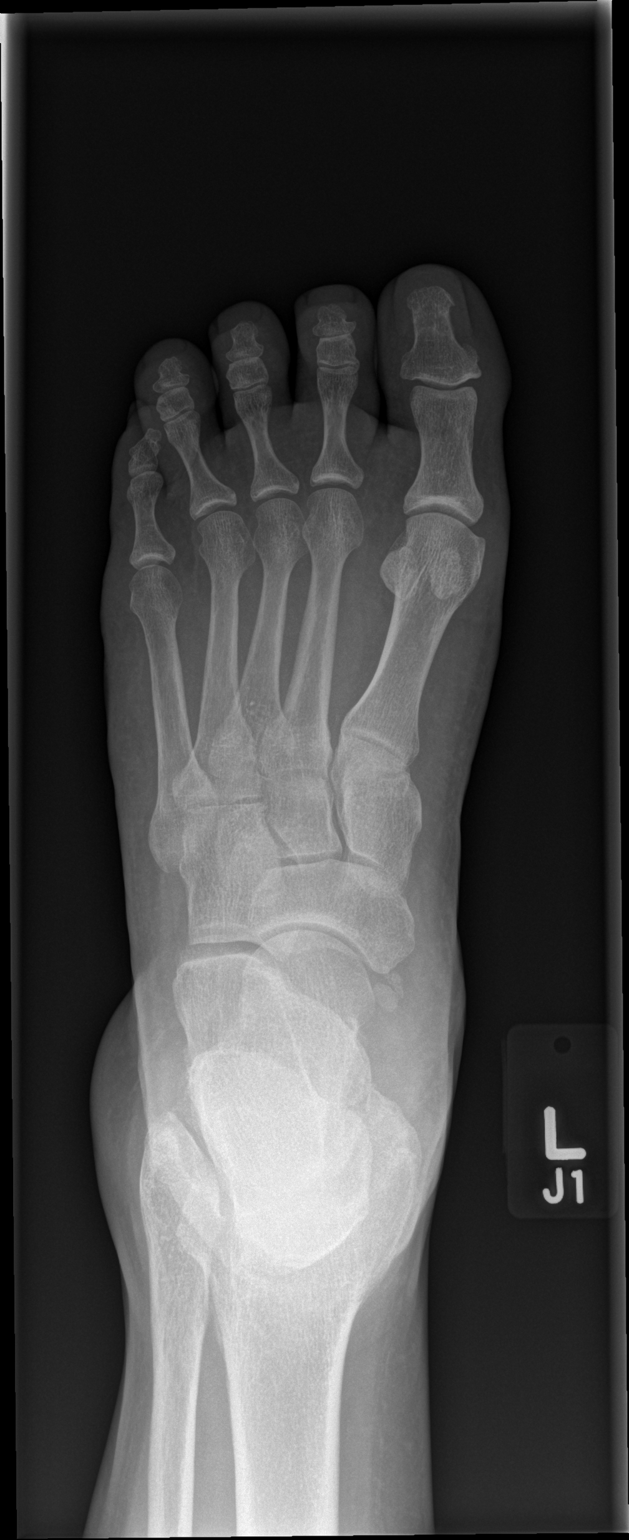

[x foot obl left]
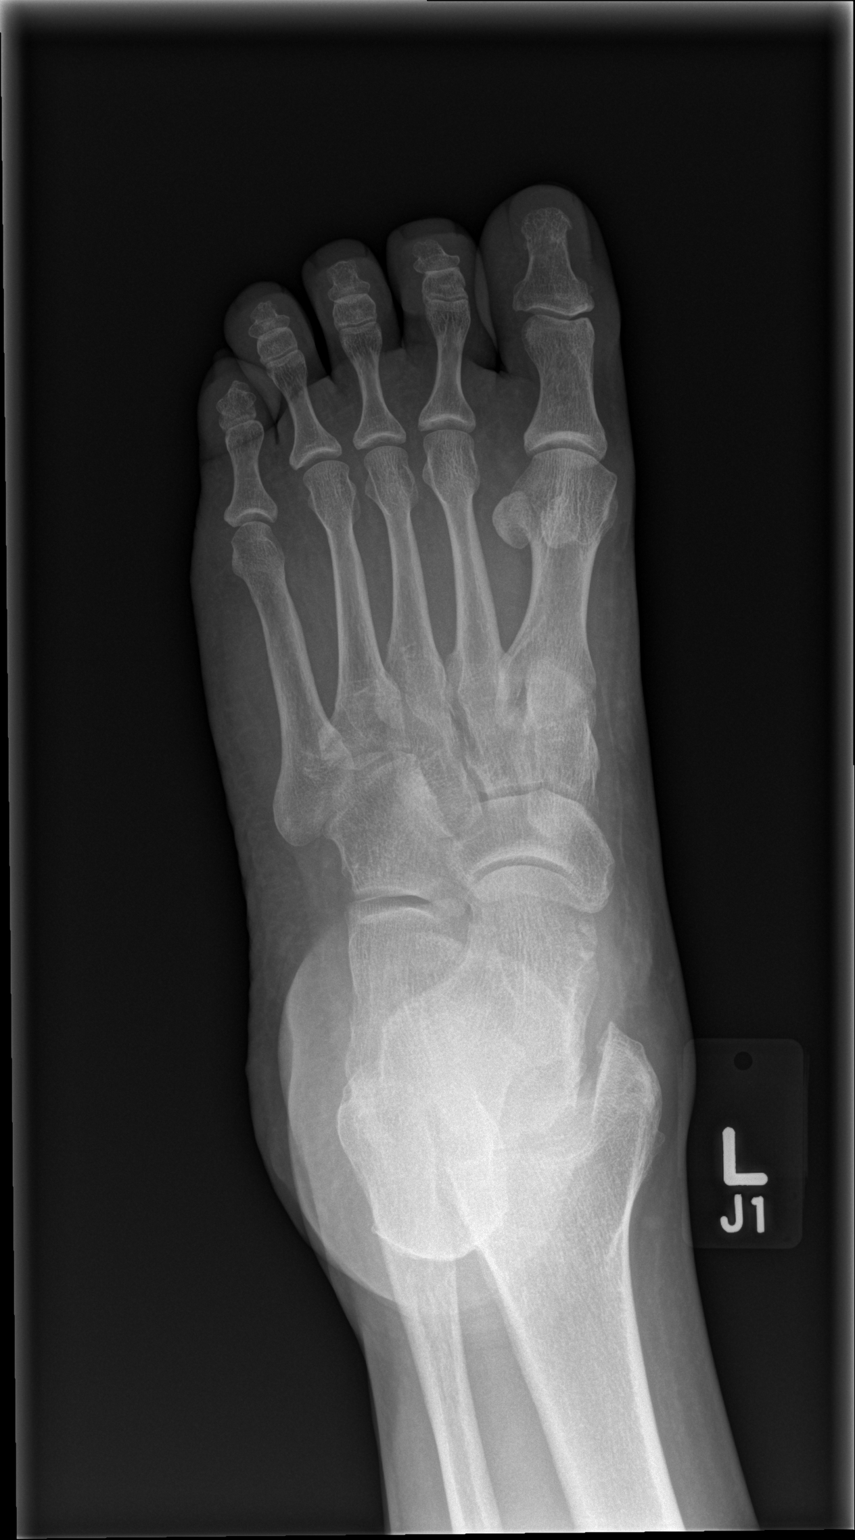

[x foot lat left (1 of 2)]
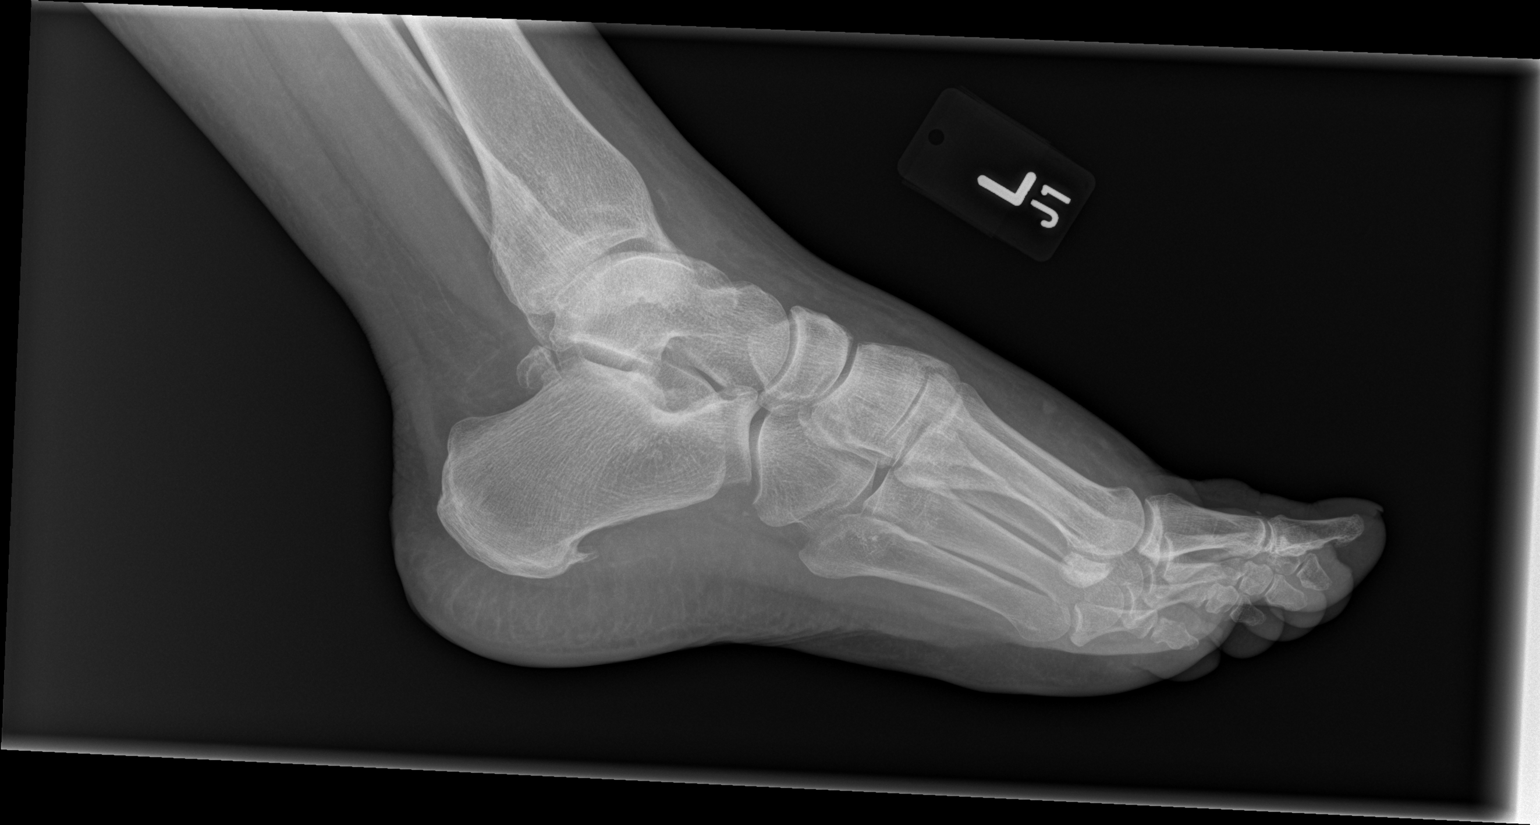

[x foot lat left (2 of 2)]
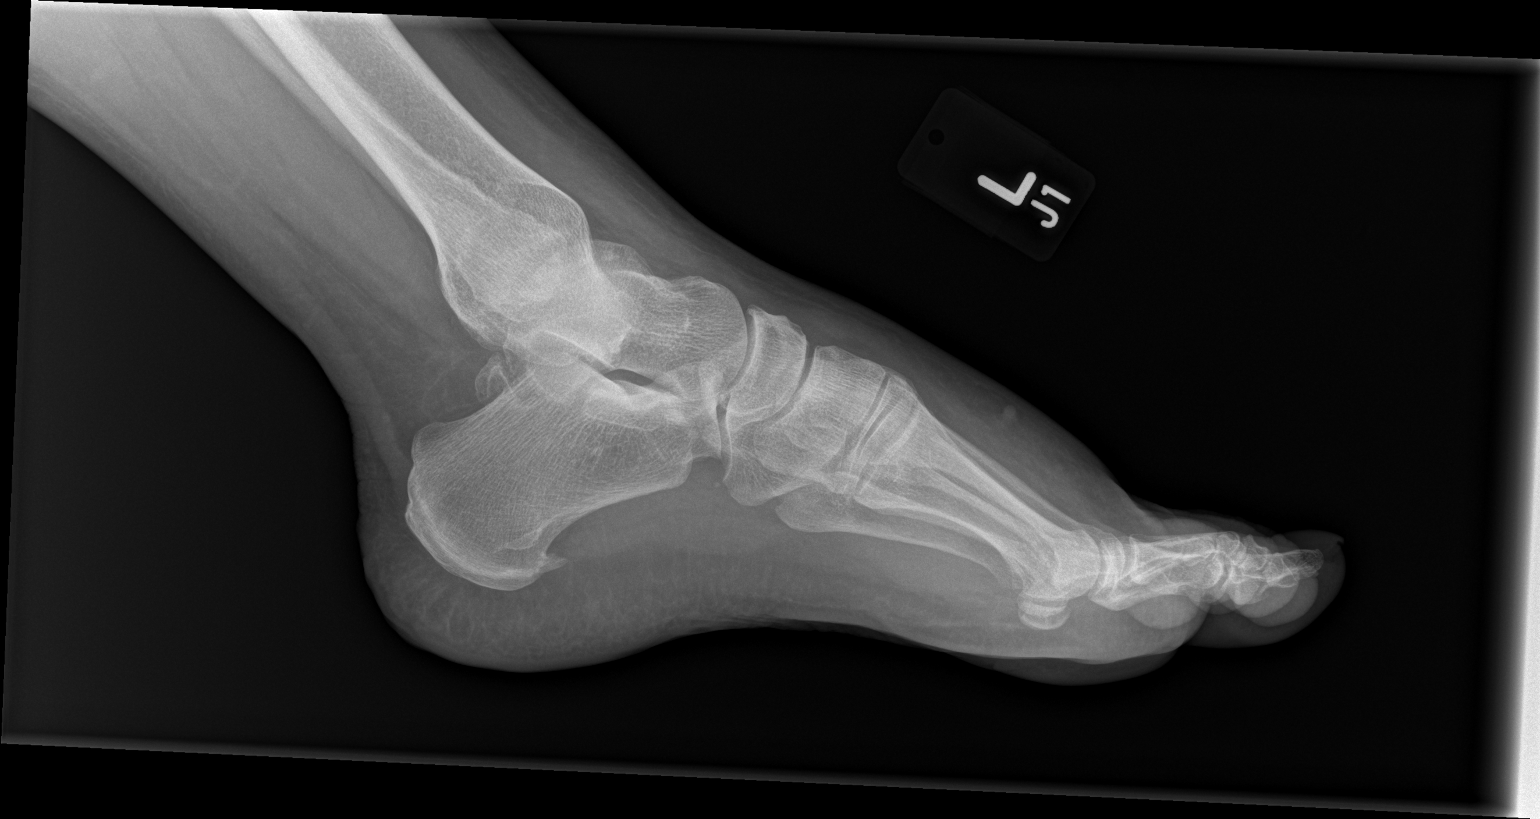

[4 of 4 positions shown; findings below may reference images not displayed]

FINDINGS: Multipartite accessory navicular without obvious findings of acute
cortical discontinuity.

No malalignment at the Lisfranc joint. Plantar calcaneal spur noted.
Soft tissue swelling noted along the dorsum of the foot overlying
the metatarsals.
IMPRESSION: 1. Soft tissue swelling along the dorsum of the foot.
2. Multipartite accessory navicular, likely chronic.
3. No acute fracture identified.

## 2023-10-07 ENCOUNTER — Encounter: Payer: Self-pay | Admitting: Internal Medicine
